# Patient Record
Sex: Female | Born: 2000 | Race: White | Hispanic: No | Marital: Single
Health system: Southern US, Community
[De-identification: ages and names within clinical notes are randomized; demographics above are authoritative.]

## PROBLEM LIST (undated history)

## (undated) DIAGNOSIS — U071 COVID-19: Secondary | ICD-10-CM

## (undated) DIAGNOSIS — J45909 Unspecified asthma, uncomplicated: Secondary | ICD-10-CM

## (undated) DIAGNOSIS — Z6710 Type A blood, Rh positive: Secondary | ICD-10-CM

## (undated) DIAGNOSIS — M542 Cervicalgia: Secondary | ICD-10-CM

## (undated) DIAGNOSIS — M419 Scoliosis, unspecified: Secondary | ICD-10-CM

## (undated) HISTORY — DX: Unspecified asthma, uncomplicated: J45.909

## (undated) HISTORY — DX: COVID-19: U07.1

## (undated) HISTORY — DX: Scoliosis, unspecified: M41.9

## (undated) HISTORY — PX: NO PAST SURGERIES: SHX2092

## (undated) HISTORY — DX: Type A blood, Rh positive: Z67.10

## (undated) HISTORY — DX: Cervicalgia: M54.2

---
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
## 2020-11-05 ENCOUNTER — Telehealth: Payer: Self-pay | Admitting: Internal Medicine

## 2020-11-05 NOTE — Telephone Encounter (Signed)
Agree please sch meredith

## 2020-11-05 NOTE — Telephone Encounter (Signed)
Please advise 

## 2020-11-05 NOTE — Telephone Encounter (Signed)
PT mother Lennart Pall is a patient of Dr.Mclean. Is it okay to schedule daughter as a new a patient.

## 2020-12-08 ENCOUNTER — Other Ambulatory Visit: Payer: Self-pay

## 2020-12-08 ENCOUNTER — Ambulatory Visit: Payer: BC Managed Care – PPO | Admitting: Internal Medicine

## 2020-12-08 ENCOUNTER — Encounter: Payer: Self-pay | Admitting: Internal Medicine

## 2020-12-08 VITALS — BP 110/70 | HR 70 | Temp 98.0°F | Ht 64.96 in | Wt 132.2 lb

## 2020-12-08 DIAGNOSIS — Z0000 Encounter for general adult medical examination without abnormal findings: Secondary | ICD-10-CM

## 2020-12-08 DIAGNOSIS — N6320 Unspecified lump in the left breast, unspecified quadrant: Secondary | ICD-10-CM

## 2020-12-08 DIAGNOSIS — L700 Acne vulgaris: Secondary | ICD-10-CM

## 2020-12-08 DIAGNOSIS — M542 Cervicalgia: Secondary | ICD-10-CM

## 2020-12-08 DIAGNOSIS — M4186 Other forms of scoliosis, lumbar region: Secondary | ICD-10-CM

## 2020-12-08 NOTE — Patient Instructions (Addendum)
Heat salonpas or lidocaine Tylenol as needed  Hot stone massage  volatren gel 4x per day as needed  Ultrasound left breast   Neck Exercises Ask your health care provider which exercises are safe for you. Do exercises exactly as told by your health care provider and adjust them as directed. It is normal to feel mild stretching, pulling, tightness, or discomfort as you do these exercises. Stop right away if you feel sudden pain or your pain gets worse. Do not begin these exercises until told by your health care provider. Neck exercises can be important for many reasons. They can improve strength and maintain flexibility in your neck, which will help your upper back and prevent neck pain. Stretching exercises Rotation neck stretching 1. Sit in a chair or stand up. 2. Place your feet flat on the floor, shoulder width apart. 3. Slowly turn your head (rotate) to the right until a slight stretch is felt. Turn it all the way to the right so you can look over your right shoulder. Do not tilt or tip your head. 4. Hold this position for 10-30 seconds. 5. Slowly turn your head (rotate) to the left until a slight stretch is felt. Turn it all the way to the left so you can look over your left shoulder. Do not tilt or tip your head. 6. Hold this position for 10-30 seconds. Repeat __________ times. Complete this exercise __________ times a day.   Neck retraction 1. Sit in a sturdy chair or stand up. 2. Look straight ahead. Do not bend your neck. 3. Use your fingers to push your chin backward (retraction). Do not bend your neck for this movement. Continue to face straight ahead. If you are doing the exercise properly, you will feel a slight sensation in your throat and a stretch at the back of your neck. 4. Hold the stretch for 1-2 seconds. Repeat __________ times. Complete this exercise __________ times a day. Strengthening exercises Neck press 1. Lie on your back on a firm bed or on the floor with a  pillow under your head. 2. Use your neck muscles to push your head down on the pillow and straighten your spine. 3. Hold the position as well as you can. Keep your head facing up (in a neutral position) and your chin tucked. 4. Slowly count to 5 while holding this position. Repeat __________ times. Complete this exercise __________ times a day. Isometrics These are exercises in which you strengthen the muscles in your neck while keeping your neck still (isometrics). 1. Sit in a supportive chair and place your hand on your forehead. 2. Keep your head and face facing straight ahead. Do not flex or extend your neck while doing isometrics. 3. Push forward with your head and neck while pushing back with your hand. Hold for 10 seconds. 4. Do the sequence again, this time putting your hand against the back of your head. Use your head and neck to push backward against the hand pressure. 5. Finally, do the same exercise on either side of your head, pushing sideways against the pressure of your hand. Repeat __________ times. Complete this exercise __________ times a day. Prone head lifts 1. Lie face-down (prone position), resting on your elbows so that your chest and upper back are raised. 2. Start with your head facing downward, near your chest. Position your chin either on or near your chest. 3. Slowly lift your head upward. Lift until you are looking straight ahead. Then continue lifting your head  as far back as you can comfortably stretch. 4. Hold your head up for 5 seconds. Then slowly lower it to your starting position. Repeat __________ times. Complete this exercise __________ times a day. Supine head lifts 1. Lie on your back (supine position), bending your knees to point to the ceiling and keeping your feet flat on the floor. 2. Lift your head slowly off the floor, raising your chin toward your chest. 3. Hold for 5 seconds. Repeat __________ times. Complete this exercise __________ times a  day. Scapular retraction 1. Stand with your arms at your sides. Look straight ahead. 2. Slowly pull both shoulders (scapulae) backward and downward (retraction) until you feel a stretch between your shoulder blades in your upper back. 3. Hold for 10-30 seconds. 4. Relax and repeat. Repeat __________ times. Complete this exercise __________ times a day. Contact a health care provider if:  Your neck pain or discomfort gets much worse when you do an exercise.  Your neck pain or discomfort does not improve within 2 hours after you exercise. If you have any of these problems, stop exercising right away. Do not do the exercises again unless your health care provider says that you can. Get help right away if:  You develop sudden, severe neck pain. If this happens, stop exercising right away. Do not do the exercises again unless your health care provider says that you can. This information is not intended to replace advice given to you by your health care provider. Make sure you discuss any questions you have with your health care provider. Document Revised: 05/02/2018 Document Reviewed: 05/02/2018 Elsevier Patient Education  2021 [employer]  Low Back Sprain or Strain Rehab Ask your health care provider which exercises are safe for you. Do exercises exactly as told by your health care provider and adjust them as directed. It is normal to feel mild stretching, pulling, tightness, or discomfort as you do these exercises. Stop right away if you feel sudden pain or your pain gets worse. Do not begin these exercises until told by your health care provider. Stretching and range-of-motion exercises These exercises warm up your muscles and joints and improve the movement and flexibility of your back. These exercises also help to relieve pain, numbness, and tingling. Lumbar rotation 1. Lie on your back on a firm surface and bend your knees. 2. Straighten your arms out to your sides so each arm forms a  90-degree angle (right angle) with a side of your body. 3. Slowly move (rotate) both of your knees to one side of your body until you feel a stretch in your lower back (lumbar). Try not to let your shoulders lift off the floor. 4. Hold this position for __________ seconds. 5. Tense your abdominal muscles and slowly move your knees back to the starting position. 6. Repeat this exercise on the other side of your body. Repeat __________ times. Complete this exercise __________ times a day.   Single knee to chest 1. Lie on your back on a firm surface with both legs straight. 2. Bend one of your knees. Use your hands to move your knee up toward your chest until you feel a gentle stretch in your lower back and buttock. ? Hold your leg in this position by holding on to the front of your knee. ? Keep your other leg as straight as possible. 3. Hold this position for __________ seconds. 4. Slowly return to the starting position. 5. Repeat with your other leg. Repeat __________ times. Complete  this exercise __________ times a day.   Prone extension on elbows 1. Lie on your abdomen on a firm surface (prone position). 2. Prop yourself up on your elbows. 3. Use your arms to help lift your chest up until you feel a gentle stretch in your abdomen and your lower back. ? This will place some of your body weight on your elbows. If this is uncomfortable, try stacking pillows under your chest. ? Your hips should stay down, against the surface that you are lying on. Keep your hip and back muscles relaxed. 4. Hold this position for __________ seconds. 5. Slowly relax your upper body and return to the starting position. Repeat __________ times. Complete this exercise __________ times a day.   Strengthening exercises These exercises build strength and endurance in your back. Endurance is the ability to use your muscles for a long time, even after they get tired. Pelvic tilt This exercise strengthens the muscles  that lie deep in the abdomen. 1. Lie on your back on a firm surface. Bend your knees and keep your feet flat on the floor. 2. Tense your abdominal muscles. Tip your pelvis up toward the ceiling and flatten your lower back into the floor. ? To help with this exercise, you may place a small towel under your lower back and try to push your back into the towel. 3. Hold this position for __________ seconds. 4. Let your muscles relax completely before you repeat this exercise. Repeat __________ times. Complete this exercise __________ times a day. Alternating arm and leg raises 1. Get on your hands and knees on a firm surface. If you are on a hard floor, you may want to use padding, such as an exercise mat, to cushion your knees. 2. Line up your arms and legs. Your hands should be directly below your shoulders, and your knees should be directly below your hips. 3. Lift your left leg behind you. At the same time, raise your right arm and straighten it in front of you. ? Do not lift your leg higher than your hip. ? Do not lift your arm higher than your shoulder. ? Keep your abdominal and back muscles tight. ? Keep your hips facing the ground. ? Do not arch your back. ? Keep your balance carefully, and do not hold your breath. 4. Hold this position for __________ seconds. 5. Slowly return to the starting position. 6. Repeat with your right leg and your left arm. Repeat __________ times. Complete this exercise __________ times a day.   Abdominal set with straight leg raise 1. Lie on your back on a firm surface. 2. Bend one of your knees and keep your other leg straight. 3. Tense your abdominal muscles and lift your straight leg up, 4-6 inches (10-15 cm) off the ground. 4. Keep your abdominal muscles tight and hold this position for __________ seconds. ? Do not hold your breath. ? Do not arch your back. Keep it flat against the ground. 5. Keep your abdominal muscles tense as you slowly lower your  leg back to the starting position. 6. Repeat with your other leg. Repeat __________ times. Complete this exercise __________ times a day.   Single leg lower with bent knees 1. Lie on your back on a firm surface. 2. Tense your abdominal muscles and lift your feet off the floor, one foot at a time, so your knees and hips are bent in 90-degree angles (right angles). ? Your knees should be over your hips and your  lower legs should be parallel to the floor. 3. Keeping your abdominal muscles tense and your knee bent, slowly lower one of your legs so your toe touches the ground. 4. Lift your leg back up to return to the starting position. ? Do not hold your breath. ? Do not let your back arch. Keep your back flat against the ground. 5. Repeat with your other leg. Repeat __________ times. Complete this exercise __________ times a day. Posture and body mechanics Good posture and healthy body mechanics can help to relieve stress in your body's tissues and joints. Body mechanics refers to the movements and positions of your body while you do your daily activities. Posture is part of body mechanics. Good posture means:  Your spine is in its natural S-curve position (neutral).  Your shoulders are pulled back slightly.  Your head is not tipped forward. Follow these guidelines to improve your posture and body mechanics in your everyday activities. Standing  When standing, keep your spine neutral and your feet about hip width apart. Keep a slight bend in your knees. Your ears, shoulders, and hips should line up.  When you do a task in which you stand in one place for a long time, place one foot up on a stable object that is 2-4 inches (5-10 cm) high, such as a footstool. This helps keep your spine neutral.   Sitting  When sitting, keep your spine neutral and keep your feet flat on the floor. Use a footrest, if necessary, and keep your thighs parallel to the floor. Avoid rounding your shoulders, and  avoid tilting your head forward.  When working at a desk or a computer, keep your desk at a height where your hands are slightly lower than your elbows. Slide your chair under your desk so you are close enough to maintain good posture.  When working at a computer, place your monitor at a height where you are looking straight ahead and you do not have to tilt your head forward or downward to look at the screen.   Resting  When lying down and resting, avoid positions that are most painful for you.  If you have pain with activities such as sitting, bending, stooping, or squatting, lie in a position in which your body does not bend very much. For example, avoid curling up on your side with your arms and knees near your chest (fetal position).  If you have pain with activities such as standing for a long time or reaching with your arms, lie with your spine in a neutral position and bend your knees slightly. Try the following positions: ? Lying on your side with a pillow between your knees. ? Lying on your back with a pillow under your knees. Lifting  When lifting objects, keep your feet at least shoulder width apart and tighten your abdominal muscles.  Bend your knees and hips and keep your spine neutral. It is important to lift using the strength of your legs, not your back. Do not lock your knees straight out.  Always ask for help to lift heavy or awkward objects.   This information is not intended to replace advice given to you by your health care provider. Make sure you discuss any questions you have with your health care provider. Document Revised: 10/26/2018 Document Reviewed: 07/26/2018 Elsevier Patient Education  2021 [employer]  Back Exercises The following exercises strengthen the muscles that help to support the trunk and back. They also help to keep the lower  back flexible. Doing these exercises can help to prevent back pain or lessen existing pain.  If you have back pain or  discomfort, try doing these exercises 2-3 times each day or as told by your health care provider.  As your pain improves, do them once each day, but increase the number of times that you repeat the steps for each exercise (do more repetitions).  To prevent the recurrence of back pain, continue to do these exercises once each day or as told by your health care provider. Do exercises exactly as told by your health care provider and adjust them as directed. It is normal to feel mild stretching, pulling, tightness, or discomfort as you do these exercises, but you should stop right away if you feel sudden pain or your pain gets worse. Exercises Single knee to chest Repeat these steps 3-5 times for each leg: 5. Lie on your back on a firm bed or the floor with your legs extended. 6. Bring one knee to your chest. Your other leg should stay extended and in contact with the floor. 7. Hold your knee in place by grabbing your knee or thigh with both hands and hold. 8. Pull on your knee until you feel a gentle stretch in your lower back or buttocks. 9. Hold the stretch for 10-30 seconds. 10. Slowly release and straighten your leg. Pelvic tilt Repeat these steps 5-10 times: 5. Lie on your back on a firm bed or the floor with your legs extended. 6. Bend your knees so they are pointing toward the ceiling and your feet are flat on the floor. 7. Tighten your lower abdominal muscles to press your lower back against the floor. This motion will tilt your pelvis so your tailbone points up toward the ceiling instead of pointing to your feet or the floor. 8. With gentle tension and even breathing, hold this position for 5-10 seconds. Cat-cow Repeat these steps until your lower back becomes more flexible: 6. Get into a hands-and-knees position on a firm surface. Keep your hands under your shoulders, and keep your knees under your hips. You may place padding under your knees for comfort. 7. Let your head hang down  toward your chest. Contract your abdominal muscles and point your tailbone toward the floor so your lower back becomes rounded like the back of a cat. 8. Hold this position for 5 seconds. 9. Slowly lift your head, let your abdominal muscles relax and point your tailbone up toward the ceiling so your back forms a sagging arch like the back of a cow. 10. Hold this position for 5 seconds.   Press-ups Repeat these steps 5-10 times: 5. Lie on your abdomen (face-down) on the floor. 6. Place your palms near your head, about shoulder-width apart. 7. Keeping your back as relaxed as possible and keeping your hips on the floor, slowly straighten your arms to raise the top half of your body and lift your shoulders. Do not use your back muscles to raise your upper torso. You may adjust the placement of your hands to make yourself more comfortable. 8. Hold this position for 5 seconds while you keep your back relaxed. 9. Slowly return to lying flat on the floor.   Bridges Repeat these steps 10 times: 4. Lie on your back on a firm surface. 5. Bend your knees so they are pointing toward the ceiling and your feet are flat on the floor. Your arms should be flat at your sides, next to your body. 6.  Tighten your buttocks muscles and lift your buttocks off the floor until your waist is at almost the same height as your knees. You should feel the muscles working in your buttocks and the back of your thighs. If you do not feel these muscles, slide your feet 1-2 inches farther away from your buttocks. 7. Hold this position for 3-5 seconds. 8. Slowly lower your hips to the starting position, and allow your buttocks muscles to relax completely. If this exercise is too easy, try doing it with your arms crossed over your chest.   Abdominal crunches Repeat these steps 5-10 times: 5. Lie on your back on a firm bed or the floor with your legs extended. 6. Bend your knees so they are pointing toward the ceiling and your feet  are flat on the floor. 7. Cross your arms over your chest. 8. Tip your chin slightly toward your chest without bending your neck. 9. Tighten your abdominal muscles and slowly raise your trunk (torso) high enough to lift your shoulder blades a tiny bit off the floor. Avoid raising your torso higher than that because it can put too much stress on your low back and does not help to strengthen your abdominal muscles. 10. Slowly return to your starting position. Back lifts Repeat these steps 5-10 times: 1. Lie on your abdomen (face-down) with your arms at your sides, and rest your forehead on the floor. 2. Tighten the muscles in your legs and your buttocks. 3. Slowly lift your chest off the floor while you keep your hips pressed to the floor. Keep the back of your head in line with the curve in your back. Your eyes should be looking at the floor. 4. Hold this position for 3-5 seconds. 5. Slowly return to your starting position. Contact a health care provider if:  Your back pain or discomfort gets much worse when you do an exercise.  Your worsening back pain or discomfort does not lessen within 2 hours after you exercise. If you have any of these problems, stop doing these exercises right away. Do not do them again unless your health care provider says that you can. Get help right away if:  You develop sudden, severe back pain. If this happens, stop doing the exercises right away. Do not do them again unless your health care provider says that you can. This information is not intended to replace advice given to you by your health care provider. Make sure you discuss any questions you have with your health care provider. Document Revised: 11/08/2018 Document Reviewed: 04/05/2018 Elsevier Patient Education  2021 [employer].
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
## 2020-12-09 ENCOUNTER — Encounter: Payer: Self-pay | Admitting: Internal Medicine

## 2020-12-09 DIAGNOSIS — L700 Acne vulgaris: Secondary | ICD-10-CM | POA: Insufficient documentation

## 2020-12-09 DIAGNOSIS — Z0000 Encounter for general adult medical examination without abnormal findings: Secondary | ICD-10-CM | POA: Insufficient documentation

## 2020-12-09 HISTORY — DX: Encounter for general adult medical examination without abnormal findings: Z00.00

## 2020-12-11 ENCOUNTER — Other Ambulatory Visit: Payer: Self-pay

## 2020-12-11 ENCOUNTER — Ambulatory Visit
Admission: RE | Admit: 2020-12-11 | Discharge: 2020-12-11 | Disposition: A | Discharge status: Home / Self Care | Payer: BC Managed Care – PPO | Source: Ambulatory Visit | Attending: Internal Medicine | Admitting: Internal Medicine

## 2020-12-11 DIAGNOSIS — N6320 Unspecified lump in the left breast, unspecified quadrant: Secondary | ICD-10-CM | POA: Insufficient documentation

## 2020-12-24 ENCOUNTER — Other Ambulatory Visit (INDEPENDENT_AMBULATORY_CARE_PROVIDER_SITE_OTHER): Payer: BC Managed Care – PPO

## 2020-12-24 ENCOUNTER — Other Ambulatory Visit: Payer: Self-pay

## 2020-12-24 DIAGNOSIS — D729 Disorder of white blood cells, unspecified: Secondary | ICD-10-CM

## 2020-12-24 DIAGNOSIS — Z0000 Encounter for general adult medical examination without abnormal findings: Secondary | ICD-10-CM

## 2020-12-24 DIAGNOSIS — M4186 Other forms of scoliosis, lumbar region: Secondary | ICD-10-CM | POA: Diagnosis not present

## 2020-12-24 LAB — CBC WITH DIFFERENTIAL/PLATELET
Basophils Absolute: 0 10*3/uL (ref 0.0–0.1)
Basophils Relative: 0.5 % (ref 0.0–3.0)
Eosinophils Absolute: 0.1 10*3/uL (ref 0.0–0.7)
Eosinophils Relative: 1.8 % (ref 0.0–5.0)
HCT: 39.8 % (ref 36.0–46.0)
Hemoglobin: 13.5 g/dL (ref 12.0–15.0)
Lymphocytes Relative: 41.5 % (ref 12.0–46.0)
Lymphs Abs: 1.6 10*3/uL (ref 0.7–4.0)
MCHC: 33.9 g/dL (ref 30.0–36.0)
MCV: 95.1 fl (ref 78.0–100.0)
Monocytes Absolute: 0.4 10*3/uL (ref 0.1–1.0)
Monocytes Relative: 9.6 % (ref 3.0–12.0)
Neutro Abs: 1.8 10*3/uL (ref 1.4–7.7)
Neutrophils Relative %: 46.6 % (ref 43.0–77.0)
Platelets: 235 10*3/uL (ref 150.0–400.0)
RBC: 4.18 Mil/uL (ref 3.87–5.11)
RDW: 13.8 % (ref 11.5–14.6)
WBC: 3.9 10*3/uL — ABNORMAL LOW (ref 4.5–10.5)

## 2020-12-24 LAB — LIPID PANEL
Cholesterol: 160 mg/dL (ref 0–200)
HDL: 73.8 mg/dL (ref >39.00)
LDL Cholesterol: 69 mg/dL (ref 0–99)
NonHDL: 86.12
Total CHOL/HDL Ratio: 2
Triglycerides: 84 mg/dL (ref 0.0–149.0)
VLDL: 16.8 mg/dL (ref 0.0–40.0)

## 2020-12-24 LAB — COMPREHENSIVE METABOLIC PANEL
ALT: 22 U/L (ref 0–35)
AST: 26 U/L (ref 0–37)
Albumin: 4.2 g/dL (ref 3.5–5.2)
Alkaline Phosphatase: 30 U/L — ABNORMAL LOW (ref 39–117)
BUN: 21 mg/dL (ref 6–23)
CO2: 28 mEq/L (ref 19–32)
Calcium: 9 mg/dL (ref 8.4–10.5)
Chloride: 103 mEq/L (ref 96–112)
Creatinine, Ser: 0.8 mg/dL (ref 0.40–1.20)
GFR: 106.19 mL/min (ref >60.00)
Glucose, Bld: 83 mg/dL (ref 70–99)
Potassium: 4.1 mEq/L (ref 3.5–5.1)
Sodium: 137 mEq/L (ref 135–145)
Total Bilirubin: 0.4 mg/dL (ref 0.2–1.2)
Total Protein: 7.1 g/dL (ref 6.0–8.3)

## 2020-12-24 LAB — TSH: TSH: 1.16 u[IU]/mL (ref 0.35–5.50)

## 2020-12-24 LAB — VITAMIN D 25 HYDROXY (VIT D DEFICIENCY, FRACTURES): VITD: 47.15 ng/mL (ref 30.00–100.00)

## 2020-12-25 LAB — URINALYSIS, ROUTINE W REFLEX MICROSCOPIC
Bilirubin Urine: NEGATIVE
Glucose, UA: NEGATIVE
Hgb urine dipstick: NEGATIVE
Ketones, ur: NEGATIVE
Leukocytes,Ua: NEGATIVE
Nitrite: NEGATIVE
Protein, ur: NEGATIVE
Specific Gravity, Urine: 1.018 (ref 1.001–1.035)
pH: 7 (ref 5.0–8.0)

## 2020-12-25 LAB — HEPATITIS B SURFACE ANTIBODY, QUANTITATIVE: Hep B S AB Quant (Post): <5 m[IU]/mL — ABNORMAL LOW (ref >=10)

## 2020-12-29 NOTE — Addendum Note (Signed)
Addended byElise Benne T on: 12/29/2020 03:10 PM   Modules accepted: Orders

## 2021-01-21 ENCOUNTER — Encounter: Payer: Self-pay | Admitting: Internal Medicine

## 2021-01-21 ENCOUNTER — Other Ambulatory Visit: Payer: Self-pay

## 2021-01-21 ENCOUNTER — Ambulatory Visit: Payer: BC Managed Care – PPO | Admitting: Internal Medicine

## 2021-01-21 VITALS — BP 98/68 | HR 52 | Temp 97.6°F | Ht 64.96 in | Wt 129.4 lb

## 2021-01-21 DIAGNOSIS — R5383 Other fatigue: Secondary | ICD-10-CM

## 2021-01-21 DIAGNOSIS — Z0000 Encounter for general adult medical examination without abnormal findings: Secondary | ICD-10-CM

## 2021-01-21 DIAGNOSIS — Z23 Encounter for immunization: Secondary | ICD-10-CM

## 2021-01-21 DIAGNOSIS — D72819 Decreased white blood cell count, unspecified: Secondary | ICD-10-CM

## 2021-01-21 DIAGNOSIS — E538 Deficiency of other specified B group vitamins: Secondary | ICD-10-CM | POA: Diagnosis not present

## 2021-01-21 LAB — CBC WITH DIFFERENTIAL/PLATELET
Basophils Absolute: 0 10*3/uL (ref 0.0–0.1)
Basophils Relative: 0.6 % (ref 0.0–3.0)
Eosinophils Absolute: 0 10*3/uL (ref 0.0–0.7)
Eosinophils Relative: 1.1 % (ref 0.0–5.0)
HCT: 41 % (ref 36.0–46.0)
Hemoglobin: 13.9 g/dL (ref 12.0–15.0)
Lymphocytes Relative: 41.3 % (ref 12.0–46.0)
Lymphs Abs: 1.8 10*3/uL (ref 0.7–4.0)
MCHC: 34 g/dL (ref 30.0–36.0)
MCV: 95.7 fl (ref 78.0–100.0)
Monocytes Absolute: 0.4 10*3/uL (ref 0.1–1.0)
Monocytes Relative: 8.4 % (ref 3.0–12.0)
Neutro Abs: 2.1 10*3/uL (ref 1.4–7.7)
Neutrophils Relative %: 48.6 % (ref 43.0–77.0)
Platelets: 281 10*3/uL (ref 150.0–400.0)
RBC: 4.28 Mil/uL (ref 3.87–5.11)
RDW: 13.7 % (ref 11.5–14.6)
WBC: 4.3 10*3/uL — ABNORMAL LOW (ref 4.5–10.5)

## 2021-01-21 LAB — VITAMIN B12: Vitamin B-12: 683 pg/mL (ref 211–911)
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
## 2021-01-21 NOTE — Patient Instructions (Signed)
Hiatal Hernia A hiatal hernia occurs when part of the stomach slides above the muscle that separates the abdomen from the chest (diaphragm). A person can be born with a hiatal hernia (congenital), or it may develop over time. In almost all cases of hiatal hernia, only the top part of the stomach pushes through the diaphragm. Many people have a hiatal hernia with no symptoms. The larger the hernia, the more likely it is that you will have symptoms. In some cases, a hiatal hernia allows stomach acid to flow back into the tube that carries food from your mouth to your stomach (esophagus). This may cause heartburn symptoms. Severe heartburn symptoms may mean that you have developed a condition called gastroesophageal reflux disease (GERD). What are the causes? This condition is caused by a weakness in the opening (hiatus) where the esophagus passes through the diaphragm to attach to the upper part of the stomach. A person may be born with a weakness in the hiatus, or a weakness can develop over time. What increases the risk? This condition is more likely to develop in: Older people. Age is a major risk factor for a hiatal hernia, especially if you are over the age of 50. Pregnant women. People who are overweight. People who have frequent constipation. What are the signs or symptoms? Symptoms of this condition usually develop in the form of GERD symptoms. Symptoms include: Heartburn. Belching. Indigestion. Trouble swallowing. Coughing or wheezing. Sore throat. Hoarseness. Chest pain. Nausea and vomiting. How is this diagnosed? This condition may be diagnosed during testing for GERD. Tests that may be done include: X-rays of your stomach or chest. An upper gastrointestinal (GI) series. This is an X-ray exam of your GI tract that is taken after you swallow a chalky liquid that shows up clearly on the X-ray. Endoscopy. This is a procedure to look into your stomach using a thin, flexible tube that  has a tiny camera and light on the end of it. How is this treated? This condition may be treated by: Dietary and lifestyle changes to help reduce GERD symptoms. Medicines. These may include: Over-the-counter antacids. Medicines that make your stomach empty more quickly. Medicines that block the production of stomach acid (H2 blockers). Stronger medicines to reduce stomach acid (proton pump inhibitors). Surgery to repair the hernia, if other treatments are not helping. If you have no symptoms, you may not need treatment. Follow these instructions at home: Lifestyle and activity Do not use any products that contain nicotine or tobacco, such as cigarettes and e-cigarettes. If you need help quitting, ask your health care provider. Try to achieve and maintain a healthy body weight. Avoid putting pressure on your abdomen. Anything that puts pressure on your abdomen increases the amount of acid that may be pushed up into your esophagus. Avoid bending over, especially after eating. Raise the head of your bed by putting blocks under the legs. This keeps your head and esophagus higher than your stomach. Do not wear tight clothing around your chest or stomach. Try not to strain when having a bowel movement, when urinating, or when lifting heavy objects. Eating and drinking Avoid foods that can worsen GERD symptoms. These may include: Fatty foods, like fried foods. Citrus fruits, like oranges or lemon. Other foods and drinks that contain acid, like orange juice or tomatoes. Spicy food. Chocolate. Eat frequent small meals instead of three large meals a day. This helps prevent your stomach from getting too full. Eat slowly. Do not lie down right after   eating. Do not eat 1-2 hours before bed. Do not drink beverages with caffeine. These include cola, coffee, cocoa, and tea. Do not drink alcohol. General instructions Take over-the-counter and prescription medicines only as told by your health care  provider. Keep all follow-up visits as told by your health care provider. This is important. Contact a health care provider if: Your symptoms are not controlled with medicines or lifestyle changes. You are having trouble swallowing. You have coughing or wheezing that will not go away. Get help right away if: Your pain is getting worse. Your pain spreads to your arms, neck, jaw, teeth, or back. You have shortness of breath. You sweat for no reason. You feel sick to your stomach (nauseous) or you vomit. You vomit blood. You have bright red blood in your stools. You have black, tarry stools. Summary A hiatal hernia occurs when part of the stomach slides above the muscle that separates the abdomen from the chest (diaphragm). A person may be born with a weakness in the hiatus, or a weakness can develop over time. Symptoms of hiatal hernia may include heartburn, trouble swallowing, or sore throat. Management of hiatal hernia includes eating frequent small meals instead of three large meals a day. Get help right away if you vomit blood, have bright red blood in your stools, or have black, tarry stools. This information is not intended to replace advice given to you by your health care provider. Make sure you discuss any questions you have with your health care provider. Document Revised: 06/04/2020 Document Reviewed: 06/04/2020 Elsevier Patient Education  2022 [employer]  

## 2021-02-16 ENCOUNTER — Encounter: Payer: Self-pay | Admitting: Physician Assistant

## 2021-02-16 ENCOUNTER — Telehealth: Payer: Self-pay | Admitting: Internal Medicine

## 2021-02-16 ENCOUNTER — Telehealth: Payer: BC Managed Care – PPO | Admitting: Physician Assistant

## 2021-02-16 DIAGNOSIS — U071 COVID-19: Secondary | ICD-10-CM

## 2021-02-16 MED ORDER — ONDANSETRON HCL 4 MG PO TABS: 4.0000 mg | ORAL_TABLET | Freq: Three times a day (TID) | ORAL | 0 refills | Status: DC | PRN

## 2021-02-16 MED ORDER — BENZONATATE 100 MG PO CAPS
100.0000 mg | ORAL_CAPSULE | Freq: Three times a day (TID) | ORAL | 0 refills | Status: DC | PRN
Start: 2021-02-16 — End: 2021-08-25

## 2021-02-16 MED ORDER — IPRATROPIUM BROMIDE 0.03 % NA SOLN: 2.0000 | Freq: Two times a day (BID) | NASAL | 12 refills | Status: DC

## 2021-02-16 NOTE — Telephone Encounter (Signed)
Pt c/o sore throat, fatigue, cough, increased bowel movements and abdominal pain. Pt states that the symptoms started on Sunday 02/14/21. Pt reports no SOB or dizziness. Pt took a home test on 02/16/21 and tested positive. Consulted with PCP and recommended patient to have a mychart urgent care visit to see a [place] doctor today. Patient was concerned about the Hep B vaccine and gave PCP recommendation of 14 day quarantine. Pt was concerned about receiving the Hep B vaccine as she will be in school in a few weeks. Pt states that she will try to call in and schedule for the Hep B vaccine and may get her vaccine from the pharmacy.

## 2021-02-16 NOTE — Patient Instructions (Signed)
Hello Ted,  You are being placed in the home monitoring program for COVID-19 (commonly known as Coronavirus).  This is because you are suspected to have the virus or are known to have the virus.  If you are unsure which group you fall into call your clinic.    As part of this program, you'll answer a daily questionnaire in the MyChart mobile app. You'll receive a notification through the MyChart app when the questionnaire is available. When you log in to MyChart, you'll see the tasks in your To Do activity.       Clinicians will see any answers that are concerning and take appropriate steps.  If at any point you are having a medical emergency, call 911.  If otherwise concerned call your clinic instead of coming into the clinic or hospital.  To keep from spreading the disease you should: Stay home and limit contact with other people as much as possible.  Wash your hands frequently. Cover your coughs and sneezes with a tissue, and throw used tissues in the trash.   Clean and disinfect frequently touched surfaces and objects.    Take care of yourself by: Staying home Resting Drinking fluids Take fever-reducing medications (Tylenol/Acetaminophen and Ibuprofen)  For more information on the disease go to the Centers for Disease Control and Prevention website     COVID-19: What to Do if You Are Sick CDC has updated isolation and quarantine recommendations for the public, and is revising the CDC website to reflect these changes. These recommendations do not apply to healthcare personnel and do not supersede state, local, tribal, or territorial laws, rules, andregulations. If you have a fever, cough or other symptoms, you might have COVID-19. Most people have mild illness and are able to recover at home. If you are sick: Keep track of your symptoms. If you have an emergency warning sign (including trouble breathing), call 911. Steps to help prevent the spread of COVID-19 if you are  sick If you are sick with COVID-19 or think you might have COVID-19, follow the steps below to care for yourself and to help protect other peoplein your home and community. Stay home except to get medical care Stay home. Most people with COVID-19 have mild illness and can recover at home without medical care. Do not leave your home, except to get medical care. Do not visit public areas. Take care of yourself. Get rest and stay hydrated. Take over-the-counter medicines, such as acetaminophen, to help you feel better. Stay in touch with your doctor. Call before you get medical care. Be sure to get care if you have trouble breathing, or have any other emergency warning signs, or if you think it is an emergency. Avoid public transportation, ride-sharing, or taxis. Separate yourself from other people As much as possible, stay in a specific room and away from other people and pets in your home. If possible, you should use a separate bathroom. If you need to be around other people or animals in oroutside of the home, wear a mask. Tell your close contactsthat they may have been exposed to COVID-19. An infected person can spread COVID-19 starting 48 hours (or 2 days) before the person has any symptoms or tests positive. By letting your close contacts know they may have been exposed to COVID-19, you are helping to protect everyone. Additional guidance is available for those living in close quarters and shared housing. See COVID-19 and Animals if you have questions about pets. If you are diagnosed with  COVID-19, someone from the health department may call you. Answer the call to slow the spread. Monitor your symptoms Symptoms of COVID-19 include fever, cough, or other symptoms. Follow care instructions from your healthcare provider and local health department. Your local health authorities may give instructions on checking your symptoms and reporting information. When to seek emergency medical attention Look  for emergency warning signs* for COVID-19. If someone is showing any of these signs, seek emergency medical care immediately: Trouble breathing Persistent pain or pressure in the chest New confusion Inability to wake or stay awake Pale, gray, or blue-colored skin, lips, or nail beds, depending on skin tone *This list is not all possible symptoms. Please call your medical provider forany other symptoms that are severe or concerning to you. Call 911 or call ahead to your local emergency facility: Notify the operator that you are seeking care for someone who has or may haveCOVID-19. Call ahead before visiting your doctor Call ahead. Many medical visits for routine care are being postponed or done by phone or telemedicine. If you have a medical appointment that cannot be postponed, call your doctor's office, and tell them you have or may have COVID-19. This will help the office protect themselves and other patients. Get tested If you have symptoms of COVID-19, get tested. While waiting for test results, you stay away from others, including staying apart from those living in your household. Self-tests are one of several options for testing for the virus that causes COVID-19 and may be more convenient than laboratory-based tests and point-of-care tests. Ask your healthcare provider or your local health department if you need help interpreting your test results. You can visit your state, tribal, local, and territorial health department's website to look for the latest local information on testing sites. If you are sick, wear a mask over your nose and mouth You should wear a mask over your nose and mouth if you must be around other people or animals, including pets (even at home). You don't need to wear the mask if you are alone. If you can't put on a mask (because of trouble breathing, for example), cover your coughs and sneezes in some other way. Try to stay at least 6 feet away from other people. This  will help protect the people around you. Masks should not be placed on young children under age 47 years, anyone who has trouble breathing, or anyone who is not able to remove the mask without help. Note: During the COVID-19 pandemic, medical grade facemasks are reserved forhealthcare workers and some first responders. Cover your coughs and sneezes Cover your mouth and nose with a tissue when you cough or sneeze. Throw away used tissues in a lined trash can. Immediately wash your hands with soap and water for at least 20 seconds. If soap and water are not available, clean your hands with an alcohol-based hand sanitizer that contains at least 60% alcohol. Clean your hands often Wash your hands often with soap and water for at least 20 seconds. This is especially important after blowing your nose, coughing, or sneezing; going to the bathroom; and before eating or preparing food. Use hand sanitizer if soap and water are not available. Use an alcohol-based hand sanitizer with at least 60% alcohol, covering all surfaces of your hands and rubbing them together until they feel dry. Soap and water are the best option, especially if hands are visibly dirty. Avoid touching your eyes, nose, and mouth with unwashed hands. Handwashing Tips Avoid sharing  personal household items Do not share dishes, drinking glasses, cups, eating utensils, towels, or bedding with other people in your home. Wash these items thoroughly after using them with soap and water or put in the dishwasher. Clean all "high-touch" surfaces every day Clean and disinfect high-touch surfaces in your "sick room" and bathroom; wear disposable gloves. Let someone else clean and disinfect surfaces in common areas, but you should clean your bedroom and bathroom, if possible. If a caregiver or other person needs to clean and disinfect a sick person's bedroom or bathroom, they should do so on an as-needed basis. The caregiver/other person should wear a  mask and disposable gloves prior to cleaning. They should wait as long as possible after the person who is sick has used the bathroom before coming in to clean and use the bathroom. High-touch surfaces include phones, remote controls, counters, tabletops, doorknobs, bathroom fixtures, toilets, keyboards, tablets, and bedside tables. Clean and disinfect areas that may have blood, stool, or body fluids on them. Use household cleaners and disinfectants. Clean the area or item with soap and water or another detergent if it is dirty. Then, use a household disinfectant. Be sure to follow the instructions on the label to ensure safe and effective use of the product. Many products recommend keeping the surface wet for several minutes to ensure germs are killed. Many also recommend precautions such as wearing gloves and making sure you have good ventilation during use of the product. Use a product from [employer] List N: Disinfectants for Coronavirus (COVID-19). Complete Disinfection Guidance When you can be around others after being sick with COVID-19 Deciding when you can be around others is different for different situations. Find out when you can safely end home isolation. For any additional questions about your care,contact your healthcare provider or state or local health department. 06/24/2020 Content source: [facility] for Immunization and Respiratory Diseases ([facility]), Division of Viral Diseases This information is not intended to replace advice given to you by your health care provider. Make sure you discuss any questions you have with your healthcare provider. Document Revised: 08/21/2020 Document Reviewed: 08/21/2020 Elsevier Patient Education  2022 Elsevier Inc.   Can take to lessen severity: Vit C 500mg  twice daily Quercertin 250-500mg  twice daily Zinc 75-100mg  daily Melatonin 3-6 mg at bedtime Vit D3 1000-2000 IU daily Aspirin 81 mg daily with food Optional: Famotidine 20mg  daily Also  can add tylenol/ibuprofen as needed for fevers and body aches May add Mucinex or Mucinex DM as needed for cough/congestion  10 Things You Can Do to Manage Your COVID-19 Symptoms at Home If you have possible or confirmed COVID-19 Stay home except to get medical care. Monitor your symptoms carefully. If your symptoms get worse, call your healthcare provider immediately. Get rest and stay hydrated. If you have a medical appointment, call the healthcare provider ahead of time and tell them that you have or may have COVID-19. For medical emergencies, call 911 and notify the dispatch personnel that you have or may have COVID-19. Cover your cough and sneezes with a tissue or use the inside of your elbow. Wash your hands often with soap and water for at least 20 seconds or clean your hands with an alcohol-based hand sanitizer that contains at least 60% alcohol. As much as possible, stay in a specific room and away from other people in your home. Also, you should use a separate bathroom, if available. If you need to be around other people in or outside of the home,  wear a mask. Avoid sharing personal items with other people in your household, like dishes, towels, and bedding. Clean all surfaces that are touched often, like counters, tabletops, and doorknobs. Use household cleaning sprays or wipes according to the label instructions. [email or web address] 01/31/2020 This information is not intended to replace advice given to you by your health care provider. Make sure you discuss any questions you have with your healthcare provider. Document Revised: 08/21/2020 Document Reviewed: 08/21/2020 Elsevier Patient Education  [place].

## 2021-02-16 NOTE — Progress Notes (Signed)
Virtual Visit Consent   Amanda Finley, you are scheduled for a virtual visit with a [facility] provider today.     Just as with appointments in the office, your consent must be obtained to participate.  Your consent will be active for this visit and any virtual visit you may have with one of our providers in the next 365 days.     If you have a MyChart account, a copy of this consent can be sent to you electronically.  All virtual visits are billed to your insurance company just like a traditional visit in the office.    As this is a virtual visit, video technology does not allow for your provider to perform a traditional examination.  This may limit your provider's ability to fully assess your condition.  If your provider identifies any concerns that need to be evaluated in person or the need to arrange testing (such as labs, EKG, etc.), we will make arrangements to do so.     Although advances in technology are sophisticated, we cannot ensure that it will always work on either your end or our end.  If the connection with a video visit is poor, the visit may have to be switched to a telephone visit.  With either a video or telephone visit, we are not always able to ensure that we have a secure connection.     I need to obtain your verbal consent now.   Are you willing to proceed with your visit today?    Amanda Finley has provided verbal consent on 02/16/2021 for a virtual visit (video or telephone).   Amanda Loveless, PA-C   Date: 02/16/2021 4:18 PM   Virtual Visit via Video Note   IMargaretann Finley, connected with  Amanda Finley  ([id number], 09-04-2000) on 02/16/21 at  4:00 PM EDT by a video-enabled telemedicine application and verified that I am speaking with the correct person using two identifiers.  Location: Patient: Virtual Visit Location Patient: Home Provider: Virtual Visit Location Provider: Home Office   I discussed the limitations of  evaluation and management by telemedicine and the availability of in person appointments. The patient expressed understanding and agreed to proceed.    History of Present Illness: Amanda Finley is a 20 y.o. who identifies as a female who was assigned female at birth, and is being seen today for Covid 52.  HPI: URI  This is a new problem. The current episode started today (symptoms started on Sunday, tested positive today for Covid 19). The problem has been gradually worsening. Maximum temperature: 99.4. Associated symptoms include congestion, headaches, nausea, rhinorrhea, sinus pain and a sore throat (scratchy). Pertinent negatives include no diarrhea, ear pain, plugged ear sensation or vomiting. Associated symptoms comments: Hot flashes, myalgias. Treatments tried: sudafed, tylenol. The treatment provided mild relief.    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 (Patient not taking: Reported on 01/21/2021), Disp: , Rfl:    Adapalene-Benzoyl Peroxide 0.3-2.5 % GEL, , Disp: , Rfl:    fexofenadine (ALLEGRA) 180 MG tablet, Take 180 mg by mouth daily., Disp: , Rfl:    Probiotic Product (PROBIOTIC DAILY PO), Take by mouth., Disp: , Rfl:    TRI-ESTARYLLA 0.18/0.215/0.25 MG-35 MCG tablet, Take 1 tablet by  mouth daily., Disp: , Rfl:   Observations/Objective: Patient is well-developed, well-nourished in no acute distress.  Resting comfortably at home.  Head is normocephalic, atraumatic.  No labored breathing.  Speech is clear and coherent with logical content.  Patient is alert and oriented at baseline.    Assessment and Plan: 1. COVID-19 - ipratropium (ATROVENT) 0.03 % nasal spray; Place 2 sprays into both nostrils every 12 (twelve) hours.  Dispense: 30 mL; Refill: 12 - ondansetron (ZOFRAN) 4 MG tablet; Take 1 tablet (4 mg total) by mouth every 8 (eight) hours as needed for nausea or vomiting.  Dispense: 20 tablet; Refill: 0 - benzonatate (TESSALON) 100 MG capsule; Take 1 capsule (100 mg total) by mouth 3 (three) times daily as needed.  Dispense: 30 capsule; Refill: 0 - Continue OTC symptomatic management of choice - Will send OTC vitamins and supplement information through AVS - Zofran, tessalon, and ipratropium nasal spray provided for patient for symptomatic management - Patient enrolled in MyChart symptom monitoring - Push fluids - Rest as needed - Discussed return precautions and when to seek in-person evaluation, sent via AVS as well  Follow Up Instructions: I discussed the assessment and treatment plan with the patient. The patient was provided an opportunity to ask questions and all were answered. The patient agreed with the plan and demonstrated an understanding of the instructions.  A copy of instructions were sent to the patient via MyChart.  The patient was advised to call back or seek an in-person evaluation if the symptoms worsen or if the condition fails to improve as anticipated.  Time:  I spent 20 minutes with the patient via telehealth technology discussing the above problems/concerns.    Amanda Loveless, PA-C

## 2021-02-16 NOTE — Telephone Encounter (Signed)
Sorry for covid + 02/16/21. She can reschedule 2nd hep B 4th week in 02/2021 new vaccine 2nd shot  If needing prescription strength medication we will need to make an appointment with a provider.  These are over the counter medication options:  Mucinex dm green label for cough.  Vitamin C 1000 mg daily.  Vitamin D3 4000 Iu (units) daily.  Zinc 100 mg daily.  Quercetin 250-500 mg 2 times per day   Elderberry  Oil of oregano  cepacol or chloroseptic spray  Warm tea with honey and lemon  Hydration  Try to eat though you dont feel like it   Tylenol or Advil  Nasal saline, Flonase  Over the counter allergy medication allegra, claritin, xyzal or zyrtec     Monitor pulse oximeter, buy from [facility] if oxygen is less than 90 please go to the hospital.        Are you feeling really sick? Shortness of breath, cough, chest pain?, dizziness? Confusion   If so let me know  If worsening, go to hospital or [facility] clinic Urgent care for further treatment.

## 2021-02-16 NOTE — Telephone Encounter (Signed)
Patient tested positive for covid this morning and is requesting antiviral medication. Also patient had to cancel nurse visit for Hep B and would like to know when to come for that injection.

## 2021-02-22 ENCOUNTER — Ambulatory Visit: Payer: BC Managed Care – PPO

## 2021-03-02 ENCOUNTER — Ambulatory Visit (INDEPENDENT_AMBULATORY_CARE_PROVIDER_SITE_OTHER): Payer: BC Managed Care – PPO

## 2021-03-02 ENCOUNTER — Other Ambulatory Visit: Payer: Self-pay

## 2021-03-02 DIAGNOSIS — Z23 Encounter for immunization: Secondary | ICD-10-CM

## 2021-03-02 NOTE — Progress Notes (Signed)
Patient presenting for second Hepatitis B vaccine in 2 shot series. Given tin the left deltoid, Patient tolerated well.

## 2021-04-28 ENCOUNTER — Ambulatory Visit: Payer: BC Managed Care – PPO

## 2021-04-28 ENCOUNTER — Other Ambulatory Visit: Payer: Self-pay

## 2021-04-28 DIAGNOSIS — Z23 Encounter for immunization: Secondary | ICD-10-CM

## 2021-08-25 ENCOUNTER — Ambulatory Visit: Payer: BC Managed Care – PPO | Admitting: Internal Medicine

## 2021-08-25 ENCOUNTER — Telehealth: Payer: Self-pay | Admitting: Internal Medicine

## 2021-08-25 ENCOUNTER — Other Ambulatory Visit: Payer: Self-pay

## 2021-08-25 ENCOUNTER — Encounter: Payer: Self-pay | Admitting: Internal Medicine

## 2021-08-25 VITALS — BP 112/68 | HR 71 | Temp 98.7°F | Ht 64.0 in | Wt 138.4 lb

## 2021-08-25 DIAGNOSIS — E611 Iron deficiency: Secondary | ICD-10-CM | POA: Diagnosis not present

## 2021-08-25 DIAGNOSIS — R221 Localized swelling, mass and lump, neck: Secondary | ICD-10-CM

## 2021-08-25 DIAGNOSIS — R0902 Hypoxemia: Secondary | ICD-10-CM

## 2021-08-25 DIAGNOSIS — R220 Localized swelling, mass and lump, head: Secondary | ICD-10-CM

## 2021-08-25 DIAGNOSIS — D72819 Decreased white blood cell count, unspecified: Secondary | ICD-10-CM

## 2021-08-25 DIAGNOSIS — G4700 Insomnia, unspecified: Secondary | ICD-10-CM

## 2021-08-25 DIAGNOSIS — G479 Sleep disorder, unspecified: Secondary | ICD-10-CM | POA: Insufficient documentation

## 2021-08-25 DIAGNOSIS — R5383 Other fatigue: Secondary | ICD-10-CM | POA: Diagnosis not present

## 2021-08-25 DIAGNOSIS — R590 Localized enlarged lymph nodes: Secondary | ICD-10-CM

## 2021-08-25 HISTORY — DX: Decreased white blood cell count, unspecified: D72.819

## 2021-08-25 HISTORY — DX: Sleep disorder, unspecified: G47.9

## 2021-08-25 LAB — CBC WITH DIFFERENTIAL/PLATELET
Basophils Absolute: 0 10*3/uL (ref 0.0–0.1)
Basophils Relative: 0.5 % (ref 0.0–3.0)
Eosinophils Absolute: 0 10*3/uL (ref 0.0–0.7)
Eosinophils Relative: 0.6 % (ref 0.0–5.0)
HCT: 42.4 % (ref 36.0–46.0)
Hemoglobin: 14 g/dL (ref 12.0–15.0)
Lymphocytes Relative: 29.7 % (ref 12.0–46.0)
Lymphs Abs: 1.5 10*3/uL (ref 0.7–4.0)
MCHC: 32.9 g/dL (ref 30.0–36.0)
MCV: 96.8 fl (ref 78.0–100.0)
Monocytes Absolute: 0.4 10*3/uL (ref 0.1–1.0)
Monocytes Relative: 8.9 % (ref 3.0–12.0)
Neutro Abs: 3 10*3/uL (ref 1.4–7.7)
Neutrophils Relative %: 60.3 % (ref 43.0–77.0)
Platelets: 251 10*3/uL (ref 150.0–400.0)
RBC: 4.38 Mil/uL (ref 3.87–5.11)
RDW: 13.6 % (ref 11.5–14.6)
WBC: 4.9 10*3/uL (ref 4.5–10.5)

## 2021-08-25 LAB — COMPREHENSIVE METABOLIC PANEL
ALT: 17 U/L (ref 0–35)
AST: 21 U/L (ref 0–37)
Albumin: 4.3 g/dL (ref 3.5–5.2)
Alkaline Phosphatase: 39 U/L (ref 39–117)
BUN: 15 mg/dL (ref 6–23)
CO2: 30 mEq/L (ref 19–32)
Calcium: 9.8 mg/dL (ref 8.4–10.5)
Chloride: 100 mEq/L (ref 96–112)
Creatinine, Ser: 0.71 mg/dL (ref 0.40–1.20)
GFR: 121.97 mL/min (ref >60.00)
Glucose, Bld: 65 mg/dL — ABNORMAL LOW (ref 70–99)
Potassium: 4.4 mEq/L (ref 3.5–5.1)
Sodium: 139 mEq/L (ref 135–145)
Total Bilirubin: 0.4 mg/dL (ref 0.2–1.2)
Total Protein: 7.2 g/dL (ref 6.0–8.3)

## 2021-08-25 LAB — IBC + FERRITIN
Ferritin: 37.6 ng/mL (ref 10.0–291.0)
Iron: 206 ug/dL — ABNORMAL HIGH (ref 42–145)
Saturation Ratios: 43.5 % (ref 20.0–50.0)
TIBC: 473.2 ug/dL — ABNORMAL HIGH (ref 250.0–450.0)
Transferrin: 338 mg/dL (ref 212.0–360.0)

## 2021-08-25 LAB — T4, FREE: Free T4: 0.79 ng/dL (ref 0.60–1.60)

## 2021-08-25 LAB — TSH: TSH: 1.16 u[IU]/mL (ref 0.35–5.50)

## 2021-08-25 NOTE — Patient Instructions (Addendum)
[phone]  [address]   [address]   [place] [address]     Pulmonary for sleep study home    Sleep Apnea Sleep apnea is a condition in which breathing pauses or becomes shallow during sleep. People with sleep apnea usually snore loudly. They may have times when they gasp and stop breathing for 10 seconds or more during sleep. This may happen many times during the night. Sleep apnea disrupts your sleep and keeps your body from getting the rest that it needs. This condition can increase your risk of certain health problems, including: Heart attack. Stroke. Obesity. Type 2 diabetes. Heart failure. Irregular heartbeat. High blood pressure. The goal of treatment is to help you breathe normally again. What are the causes? The most common cause of sleep apnea is a collapsed or blocked airway. There are three kinds of sleep apnea: Obstructive sleep apnea. This kind is caused by a blocked or collapsed airway. Central sleep apnea. This kind happens when the part of the brain that controls breathing does not send the correct signals to the muscles that control breathing. Mixed sleep apnea. This is a combination of obstructive and central sleep apnea. What increases the risk? You are more likely to develop this condition if you: Are overweight. Smoke. Have a smaller than normal airway. Are older. Are female. Drink alcohol. Take sedatives or tranquilizers. Have a family history of sleep apnea. Have a tongue or tonsils that are larger than normal. What are the signs or symptoms? Symptoms of this condition include: Trouble staying asleep. Loud snoring. Morning headaches. Waking up gasping. Dry mouth or sore throat in the morning. Daytime sleepiness and tiredness. If you have daytime fatigue because of sleep apnea, you may be more likely to have: Trouble concentrating. Forgetfulness. Irritability or mood swings. Personality changes. Feelings of depression. Sexual  dysfunction. This may include loss of interest if you are female, or erectile dysfunction if you are female. How is this diagnosed? This condition may be diagnosed with: A medical history. A physical exam. A series of tests that are done while you are sleeping (sleep study). These tests are usually done in a sleep lab, but they may also be done at home. How is this treated? Treatment for this condition aims to restore normal breathing and to ease symptoms during sleep. It may involve managing health issues that can affect breathing, such as high blood pressure or obesity. Treatment may include: Sleeping on your side. Using a decongestant if you have nasal congestion. Avoiding the use of depressants, including alcohol, sedatives, and narcotics. Losing weight if you are overweight. Making changes to your diet. Quitting smoking. Using a device to open your airway while you sleep, such as: An oral appliance. This is a custom-made mouthpiece that shifts your lower jaw forward. A continuous positive airway pressure (CPAP) device. This device blows air through a mask when you breathe out (exhale). A nasal expiratory positive airway pressure (EPAP) device. This device has valves that you put into each nostril. A bi-level positive airway pressure (BIPAP) device. This device blows air through a mask when you breathe in (inhale) and breathe out (exhale). Having surgery if other treatments do not work. During surgery, excess tissue is removed to create a wider airway. Follow these instructions at home: Lifestyle Make any lifestyle changes that your health care provider recommends. Eat a healthy, well-balanced diet. Take steps to lose weight if you are overweight. Avoid using depressants, including alcohol, sedatives, and narcotics. Do  not use any products that contain nicotine or tobacco. These products include cigarettes, chewing tobacco, and vaping devices, such as e-cigarettes. If you need help  quitting, ask your health care provider. General instructions Take over-the-counter and prescription medicines only as told by your health care provider. If you were given a device to open your airway while you sleep, use it only as told by your health care provider. If you are having surgery, make sure to tell your health care provider you have sleep apnea. You may need to bring your device with you. Keep all follow-up visits. This is important. Contact a health care provider if: The device that you received to open your airway during sleep is uncomfortable or does not seem to be working. Your symptoms do not improve. Your symptoms get worse. Get help right away if: You develop: Chest pain. Shortness of breath. Discomfort in your back, arms, or stomach. You have: Trouble speaking. Weakness on one side of your body. Drooping in your face. These symptoms may represent a serious problem that is an emergency. Do not wait to see if the symptoms will go away. Get medical help right away. Call your local emergency services (911 in the [place] Do not drive yourself to the hospital. Summary Sleep apnea is a condition in which breathing pauses or becomes shallow during sleep. The most common cause is a collapsed or blocked airway. The goal of treatment is to restore normal breathing and to ease symptoms during sleep. This information is not intended to replace advice given to you by your health care provider. Make sure you discuss any questions you have with your health care provider. Document Revised: 02/10/2021 Document Reviewed: 06/12/2020 Elsevier Patient Education  2022 [employer]  Insomnia Insomnia is a sleep disorder that makes it difficult to fall asleep or stay asleep. Insomnia can cause fatigue, low energy, difficulty concentrating, mood swings, and poor performance at work or school. There are three different ways to classify insomnia: Difficulty falling asleep. Difficulty  staying asleep. Waking up too early in the morning. Any type of insomnia can be long-term (chronic) or short-term (acute). Both are common. Short-term insomnia usually lasts for three months or less. Chronic insomnia occurs at least three times a week for longer than three months. What are the causes? Insomnia may be caused by another condition, situation, or substance, such as: Anxiety. Certain medicines. Gastroesophageal reflux disease (GERD) or other gastrointestinal conditions. Asthma or other breathing conditions. Restless legs syndrome, sleep apnea, or other sleep disorders. Chronic pain. Menopause. Stroke. Abuse of alcohol, tobacco, or illegal drugs. Mental health conditions, such as depression. Caffeine. Neurological disorders, such as Alzheimer's disease. An overactive thyroid (hyperthyroidism). Sometimes, the cause of insomnia may not be known. What increases the risk? Risk factors for insomnia include: Gender. Women are affected more often than men. Age. Insomnia is more common as you get older. Stress. Lack of exercise. Irregular work schedule or working night shifts. Traveling between different time zones. Certain medical and mental health conditions. What are the signs or symptoms? If you have insomnia, the main symptom is having trouble falling asleep or having trouble staying asleep. This may lead to other symptoms, such as: Feeling fatigued or having low energy. Feeling nervous about going to sleep. Not feeling rested in the morning. Having trouble concentrating. Feeling irritable, anxious, or depressed. How is this diagnosed? This condition may be diagnosed based on: Your symptoms and medical history. Your health care provider may ask about: Your sleep habits. Any  medical conditions you have. Your mental health. A physical exam. How is this treated? Treatment for insomnia depends on the cause. Treatment may focus on treating an underlying condition that is  causing insomnia. Treatment may also include: Medicines to help you sleep. Counseling or therapy. Lifestyle adjustments to help you sleep better. Follow these instructions at home: Eating and drinking  Limit or avoid alcohol, caffeinated beverages, and cigarettes, especially close to bedtime. These can disrupt your sleep. Do not eat a large meal or eat spicy foods right before bedtime. This can lead to digestive discomfort that can make it hard for you to sleep. Sleep habits  Keep a sleep diary to help you and your health care provider figure out what could be causing your insomnia. Write down: When you sleep. When you wake up during the night. How well you sleep. How rested you feel the next day. Any side effects of medicines you are taking. What you eat and drink. Make your bedroom a dark, comfortable place where it is easy to fall asleep. Put up shades or blackout curtains to block light from outside. Use a white noise machine to block noise. Keep the temperature cool. Limit screen use before bedtime. This includes: Watching TV. Using your smartphone, tablet, or computer. Stick to a routine that includes going to bed and waking up at the same times every day and night. This can help you fall asleep faster. Consider making a quiet activity, such as reading, part of your nighttime routine. Try to avoid taking naps during the day so that you sleep better at night. Get out of bed if you are still awake after 15 minutes of trying to sleep. Keep the lights down, but try reading or doing a quiet activity. When you feel sleepy, go back to bed. General instructions Take over-the-counter and prescription medicines only as told by your health care provider. Exercise regularly, as told by your health care provider. Avoid exercise starting several hours before bedtime. Use relaxation techniques to manage stress. Ask your health care provider to suggest some techniques that may work well for you.  These may include: Breathing exercises. Routines to release muscle tension. Visualizing peaceful scenes. Make sure that you drive carefully. Avoid driving if you feel very sleepy. Keep all follow-up visits as told by your health care provider. This is important. Contact a health care provider if: You are tired throughout the day. You have trouble in your daily routine due to sleepiness. You continue to have sleep problems, or your sleep problems get worse. Get help right away if: You have serious thoughts about hurting yourself or someone else. If you ever feel like you may hurt yourself or others, or have thoughts about taking your own life, get help right away. You can go to your nearest emergency department or call: Your local emergency services (911 in the [place] A suicide crisis helpline, such as the National Suicide Prevention Lifeline at 470 057 28171-(202)489-7163 or 988 in the [place] This is open 24 hours a day. Summary Insomnia is a sleep disorder that makes it difficult to fall asleep or stay asleep. Insomnia can be long-term (chronic) or short-term (acute). Treatment for insomnia depends on the cause. Treatment may focus on treating an underlying condition that is causing insomnia. Keep a sleep diary to help you and your health care provider figure out what could be causing your insomnia. This information is not intended to replace advice given to you by your health care provider. Make sure you discuss  any questions you have with your health care provider. Document Revised: 01/27/2021 Document Reviewed: 05/14/2020 Elsevier Patient Education  2022 [employer].
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
## 2021-08-25 NOTE — Telephone Encounter (Signed)
Lft pt vm to call ofc to sch [facility]. thanks ?

## 2021-08-26 LAB — PATHOLOGIST SMEAR REVIEW

## 2021-08-26 NOTE — Telephone Encounter (Signed)
Patient returned referrals phone call to set up ultra sound.

## 2021-09-01 ENCOUNTER — Other Ambulatory Visit: Payer: Self-pay

## 2021-09-01 ENCOUNTER — Ambulatory Visit
Admission: RE | Admit: 2021-09-01 | Discharge: 2021-09-01 | Disposition: A | Discharge status: Home / Self Care | Payer: BC Managed Care – PPO | Source: Ambulatory Visit | Attending: Internal Medicine | Admitting: Internal Medicine

## 2021-09-01 DIAGNOSIS — R590 Localized enlarged lymph nodes: Secondary | ICD-10-CM | POA: Insufficient documentation

## 2021-09-02 ENCOUNTER — Telehealth: Payer: Self-pay | Admitting: Internal Medicine

## 2021-09-02 NOTE — Telephone Encounter (Signed)
Patient last seen 08/25/21. Okay for referral?

## 2021-09-02 NOTE — Telephone Encounter (Signed)
Pt called back requesting a referral to [facility]. Please advise and Thank you!

## 2021-09-03 NOTE — Telephone Encounter (Signed)
Referral placed this was f/u to [place] pt had 08/2021 ok for ent referral  This was noted in result note to let me know if desired

## 2021-09-03 NOTE — Addendum Note (Signed)
Addended by: Quentin Ore on: 09/03/2021 03:12 PM   Modules accepted: Orders

## 2021-09-06 NOTE — Telephone Encounter (Signed)
Patient informed and verbalized understanding

## 2021-09-07 ENCOUNTER — Institutional Professional Consult (permissible substitution): Payer: BC Managed Care – PPO | Admitting: Primary Care

## 2021-09-17 NOTE — Telephone Encounter (Signed)
Noted  

## 2021-10-04 ENCOUNTER — Telehealth: Payer: Self-pay

## 2021-10-04 NOTE — Telephone Encounter (Signed)
Patient never had sleep study, states she has issues with sleeping. States her O2  Level drops to 85% when sleeping and never feels rested.  ?

## 2021-10-05 ENCOUNTER — Other Ambulatory Visit: Payer: Self-pay

## 2021-10-05 ENCOUNTER — Encounter: Payer: Self-pay | Admitting: Primary Care

## 2021-10-05 ENCOUNTER — Ambulatory Visit: Payer: BC Managed Care – PPO | Admitting: Primary Care

## 2021-10-05 VITALS — BP 102/72 | HR 56 | Temp 97.8°F | Ht 64.0 in | Wt 138.4 lb

## 2021-10-05 DIAGNOSIS — R400 Somnolence: Secondary | ICD-10-CM | POA: Diagnosis not present

## 2021-10-05 DIAGNOSIS — G4700 Insomnia, unspecified: Secondary | ICD-10-CM
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
## 2021-10-05 NOTE — Patient Instructions (Signed)
? ?Sleep apnea is defined as period of 10 seconds or longer when you stop breathing at night. This can happen multiple times a night. Dx sleep apnea is when this occurs more than 5 times an hour.  ?  ?Mild OSA 5-15 apneic events an hour ?Moderate OSA 15-30 apneic events an hour ?Severe OSA > 30 apneic events an hour ?  ?Untreated sleep apnea puts you at higher risk for cardiac arrhythmias, pulmonary HTN, stroke and diabetes ?  ?Treatment options include weight loss, side sleeping position, oral appliance, CPAP therapy or referral to ENT for possible surgical options  ?  ? ?Recommendations: ?- Try OTC diphenhydramine 25-50mg  at bedtime for insomnia (after you finish pure Z) ?- Push bedtime back 30-60 mins, you can start with small increments (aim to get between 6-8 hours of sleep a night) ?- Maintain regular sleep-wake schedule ?- Get out of bed if not able to fall asleep in 20-30 mins  ?- Ok to exercise ? ?Orders: ?- Home sleep study re: nocturnal hypoxia ? ?Follow-up: ?- 4-6 week virtual visit with Waynetta Sandy NP  ? ? ?Sleep Apnea ?Sleep apnea affects breathing during sleep. It causes breathing to stop for 10 seconds or more, or to become shallow. People with sleep apnea usually snore loudly. ?It can also increase the risk of: ?Heart attack. ?Stroke. ?Being very overweight (obese). ?Diabetes. ?Heart failure. ?Irregular heartbeat. ?High blood pressure. ?The goal of treatment is to help you breathe normally again. ?What are the causes? ?The most common cause of this condition is a collapsed or blocked airway. ?There are three kinds of sleep apnea: ?Obstructive sleep apnea. This is caused by a blocked or collapsed airway. ?Central sleep apnea. This happens when the brain does not send the right signals to the muscles that control breathing. ?Mixed sleep apnea. This is a combination of obstructive and central sleep apnea. ?What increases the risk? ?Being overweight. ?Smoking. ?Having a small airway. ?Being older. ?Being  female. ?Drinking alcohol. ?Taking medicines to calm yourself (sedatives or tranquilizers). ?Having family members with the condition. ?Having a tongue or tonsils that are larger than normal. ?What are the signs or symptoms? ?Trouble staying asleep. ?Loud snoring. ?Headaches in the morning. ?Waking up gasping. ?Dry mouth or sore throat in the morning. ?Being sleepy or tired during the day. ?If you are sleepy or tired during the day, you may also: ?Not be able to focus your mind (concentrate). ?Forget things. ?Get angry a lot and have mood swings. ?Feel sad (depressed). ?Have changes in your personality. ?Have less interest in sex, if you are female. ?Be unable to have an erection, if you are female. ?How is this treated? ? ?Sleeping on your side. ?Using a medicine to get rid of mucus in your nose (decongestant). ?Avoiding the use of alcohol, medicines to help you relax, or certain pain medicines (narcotics). ?Losing weight, if needed. ?Changing your diet. ?Quitting smoking. ?Using a machine to open your airway while you sleep, such as: ?An oral appliance. This is a mouthpiece that shifts your lower jaw forward. ?A CPAP device. This device blows air through a mask when you breathe out (exhale). ?An EPAP device. This has valves that you put in each nostril. ?A BIPAP device. This device blows air through a mask when you breathe in (inhale) and breathe out. ?Having surgery if other treatments do not work. ?Follow these instructions at home: ?Lifestyle ?Make changes that your doctor recommends. ?Eat a healthy diet. ?Lose weight if needed. ?Avoid alcohol, medicines to  help you relax, and some pain medicines. ?Do not smoke or use any products that contain nicotine or tobacco. If you need help quitting, ask your doctor. ?General instructions ?Take over-the-counter and prescription medicines only as told by your doctor. ?If you were given a machine to use while you sleep, use it only as told by your doctor. ?If you are having  surgery, make sure to tell your doctor you have sleep apnea. You may need to bring your device with you. ?Keep all follow-up visits. ?Contact a doctor if: ?The machine that you were given to use during sleep bothers you or does not seem to be working. ?You do not get better. ?You get worse. ?Get help right away if: ?Your chest hurts. ?You have trouble breathing in enough air. ?You have an uncomfortable feeling in your back, arms, or stomach. ?You have trouble talking. ?One side of your body feels weak. ?A part of your face is hanging down. ?These symptoms may be an emergency. Get help right away. Call your local emergency services (911 in the [place] ?Do not wait to see if the symptoms will go away. ?Do not drive yourself to the hospital. ?Summary ?This condition affects breathing during sleep. ?The most common cause is a collapsed or blocked airway. ?The goal of treatment is to help you breathe normally while you sleep. ?This information is not intended to replace advice given to you by your health care provider. Make sure you discuss any questions you have with your health care provider. ?Document Revised: 02/10/2021 Document Reviewed: 06/12/2020 ?Elsevier Patient Education ? 2022 [employer] ? ? ?Insomnia ?Insomnia is a sleep disorder that makes it difficult to fall asleep or stay asleep. Insomnia can cause fatigue, low energy, difficulty concentrating, mood swings, and poor performance at work or school. ?There are three different ways to classify insomnia: ?Difficulty falling asleep. ?Difficulty staying asleep. ?Waking up too early in the morning. ?Any type of insomnia can be long-term (chronic) or short-term (acute). Both are common. Short-term insomnia usually lasts for three months or less. Chronic insomnia occurs at least three times a week for longer than three months. ?What are the causes? ?Insomnia may be caused by another condition, situation, or substance, such as: ?Anxiety. ?Certain  medicines. ?Gastroesophageal reflux disease (GERD) or other gastrointestinal conditions. ?Asthma or other breathing conditions. ?Restless legs syndrome, sleep apnea, or other sleep disorders. ?Chronic pain. ?Menopause. ?Stroke. ?Abuse of alcohol, tobacco, or illegal drugs. ?Mental health conditions, such as depression. ?Caffeine. ?Neurological disorders, such as Alzheimer's disease. ?An overactive thyroid (hyperthyroidism). ?Sometimes, the cause of insomnia may not be known. ?What increases the risk? ?Risk factors for insomnia include: ?Gender. Women are affected more often than men. ?Age. Insomnia is more common as you get older. ?Stress. ?Lack of exercise. ?Irregular work schedule or working night shifts. ?Traveling between different time zones. ?Certain medical and mental health conditions. ?What are the signs or symptoms? ?If you have insomnia, the main symptom is having trouble falling asleep or having trouble staying asleep. This may lead to other symptoms, such as: ?Feeling fatigued or having low energy. ?Feeling nervous about going to sleep. ?Not feeling rested in the morning. ?Having trouble concentrating. ?Feeling irritable, anxious, or depressed. ?How is this diagnosed? ?This condition may be diagnosed based on: ?Your symptoms and medical history. Your health care provider may ask about: ?Your sleep habits. ?Any medical conditions you have. ?Your mental health. ?A physical exam. ?How is this treated? ?Treatment for insomnia depends on the cause. Treatment may  focus on treating an underlying condition that is causing insomnia. Treatment may also include: ?Medicines to help you sleep. ?Counseling or therapy. ?Lifestyle adjustments to help you sleep better. ?Follow these instructions at home: ?Eating and drinking ? ?Limit or avoid alcohol, caffeinated beverages, and cigarettes, especially close to bedtime. These can disrupt your sleep. ?Do not eat a large meal or eat spicy foods right before bedtime. This  can lead to digestive discomfort that can make it hard for you to sleep. ?Sleep habits ? ?Keep a sleep diary to help you and your health care provider figure out what could be causing your insomnia. Write down: ?When you slee

## 2021-10-15 ENCOUNTER — Telehealth: Payer: Self-pay | Admitting: Primary Care

## 2021-10-15 DIAGNOSIS — G4700 Insomnia, unspecified: Secondary | ICD-10-CM

## 2021-10-15 DIAGNOSIS — R400 Somnolence: Secondary | ICD-10-CM

## 2021-10-15 HISTORY — DX: Somnolence: R40.0

## 2021-10-15 HISTORY — DX: Insomnia, unspecified: G47.00

## 2021-10-15 NOTE — Assessment & Plan Note (Addendum)
-   Patient has symptoms of insomnia, restless sleep, daytime sleepiness and nocturnal hypoxia. Symptoms have been occurring every night for the last several month. Suspicion patient could have sleep apnea, needs HST to evaluate. Advised patient to avoid driving if experiencing excessive daytime sleepiness. Focus on side sleeping position. FU in 4-6 weeks to review sleep study results and treatment options if needed.  ?

## 2021-10-15 NOTE — Telephone Encounter (Signed)
Do you want contact for CBT for insomnia  ?

## 2021-10-15 NOTE — Assessment & Plan Note (Addendum)
-   Trouble initiang and maintaining sleep. Recommend trying OTC diphenhydramine 25-50mg  at bedtime for insomnia. Sleep hygiene reviewed. Advised she try pushing back bedtime 30-60 mins, aim to get between 6-8 hours of sleep a night. Recommend she keep regular sleep-wake schedule and get regular exercise. If still having difficult can try either Doxepin or Trazodone. She would also benefit from CBT for insomnia.  ?

## 2021-10-19 NOTE — Telephone Encounter (Signed)
Thank you Dr. Annamaria Boots lol  ?Raquel Sarna- can you please pass contact information along to patient for CBT - insomnia

## 2021-10-19 NOTE — Telephone Encounter (Signed)
I'm sorry, I meant to ask if you had a contact for CBT for insomnia? I wanted to give it to one of my patients  ?Poor typing skills

## 2021-10-19 NOTE — Telephone Encounter (Signed)
Called and spoke with pt letting her know the info from CY and she verbalized understanding. Nothing further needed. 

## 2021-11-09 ENCOUNTER — Ambulatory Visit: Payer: BC Managed Care – PPO

## 2021-11-09 DIAGNOSIS — R0683 Snoring: Secondary | ICD-10-CM

## 2021-11-09 DIAGNOSIS — R400 Somnolence: Secondary | ICD-10-CM

## 2021-11-10 DIAGNOSIS — R0683 Snoring: Secondary | ICD-10-CM

## 2021-11-23 ENCOUNTER — Telehealth (INDEPENDENT_AMBULATORY_CARE_PROVIDER_SITE_OTHER): Payer: BC Managed Care – PPO | Admitting: Primary Care

## 2021-11-23 DIAGNOSIS — G4700 Insomnia, unspecified: Secondary | ICD-10-CM

## 2021-11-23 NOTE — Progress Notes (Signed)
Virtual Visit via Video Note ? ?I connected with Amanda Finley on 11/23/21 at 12:00 PM EDT by a video enabled telemedicine application and verified that I am speaking with the correct person using two identifiers. ? ?Location: ?Patient: Home ?Provider: Office ?  ?I discussed the limitations of evaluation and management by telemedicine and the availability of in person appointments. The patient expressed understanding and agreed to proceed. ? ?History of Present Illness: ?21 year old female, never smoked.  Past medical history significant for acne vulgaris, leukopenia, scoliosis.  ? ?Previous [facility] pulmonary encounter: ?10/05/2021 ?Patient referred for sleep consult by PCP d.t insomnia and restless sleep. She is a very light sleeper. She has tried black out curtains, sound machine and ear plugs. She has some associated anxiety with school. She has noticed her O2 level drops to mid 80s on her watch while sleeping. She goes to bed 8pm, she has tried pushing her bedtime later but having difficulty sleeping. Throughout the day she feels she is gasping for air. She is working on meditation which has been helping. Her boyfriend thinks she may have anxiety. She has a hard time sleeping d.t racing throughts. She will wake up at 3am. Her father has sleep apnea. Her apple watch has shown her O2 level will drop at night. Primary care recommended she try taking melatonin 3-10 mg at bedtime for insomnia symptoms. She has been taking pure Z which does help for a short period. She will sleep 6 hours and feels well rested. 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 ?Sleep questionnaire ?Symptoms-  nocturnal hypoxia, insomnia, restless sleep, daytime sleepiness ?Prior sleep study-  None  ?Bedtime- 9-10pm ?Time to fall asleep-  10-21mins ?Nocturnal awakenings- once ?Out of bed/start of day- 6-7am  ?Weight changes- 15 lbs  ?Do you operate heavy machinery- No ?Do you currently wear CPAP-No ?Do you current  wear oxygen- No ?Epworth-0 ? ?11/23/2021- Interim hx  ?Patient contacted today to review sleep study results. HST on 11/09/21 showed several obstructive respiratory events, however, these were not frequent enough to qualify for diagnosis of obstructive sleep apnea. Overall AHI was 3/hr with SpO2 low 91%. We reviewed results today. She has been practicing meditation to help with anxiety and taking diphenhydramine 50mg  at bedtime for insomnia. Her sleep is noticeably better. She still has issues falling asleep when staying somewhere new like a hotel room. During last visit we gave patient contact information or CBT for insomnia.  ?  ?Observations/Objective: ? ?- Appears well; No respiratory symptoms  ? ? ?Assessment and Plan: ? ?Insomnia: ?- Improved with meditation and prn diphenhydramine 50mg  at bedtime  ?- Patient has been given information for CBT  ? ?Daytime sleepiness: ?- HST in April 2023 showed no evidence of OSA, she had several apneas but did not meet dx for sleep apnea ?- Encourage side sleeping position or elevated head of bed while sleeping to decrease risk of apnea. Advised against driving if experiencing excessive daytime sleepiness.  ? ?Nocturnal hypoxia: ?- No evidence of hypoxia on sleep study, Spo2 low 91% RA  ?- No intervention needed  ? ?Follow Up Instructions: ? ? Follow-up as needed  ? ?I discussed the assessment and treatment plan with the patient. The patient was provided an opportunity to ask questions and all were answered. The patient agreed with the plan and demonstrated an understanding of the instructions. ?  ?The patient was advised to call back or seek an in-person evaluation if the symptoms worsen  or if the condition fails to improve as anticipated. ? ?I provided 20 minutes of non-face-to-face time during this encounter. ? ? ?Glenford Bayley, NP ? ?

## 2021-11-23 NOTE — Patient Instructions (Addendum)
HST on 11/09/21 showed several obstructive respiratory events, however, these were not frequent enough to qualify for diagnosis of obstructive sleep apnea. Overall AHI was 3/hr with SpO2 low 91% ? ?Encourage side sleeping position or elevated head of bed while sleeping to decrease risk of apnea. Advised against driving if experiencing excessive daytime sleepiness.  ? ?Follow-up as needed  ?

## 2021-11-29 ENCOUNTER — Encounter: Payer: Self-pay | Admitting: Internal Medicine

## 2021-11-29 ENCOUNTER — Other Ambulatory Visit: Payer: Self-pay | Admitting: Internal Medicine

## 2021-11-29 DIAGNOSIS — J4520 Mild intermittent asthma, uncomplicated: Secondary | ICD-10-CM

## 2021-11-29 DIAGNOSIS — J45909 Unspecified asthma, uncomplicated: Secondary | ICD-10-CM | POA: Insufficient documentation

## 2021-11-29 MED ORDER — ALBUTEROL SULFATE HFA 108 (90 BASE) MCG/ACT IN AERS: 1.0000 | INHALATION_SPRAY | Freq: Four times a day (QID) | RESPIRATORY_TRACT | 11 refills | Status: DC | PRN

## 2022-01-25 ENCOUNTER — Encounter: Payer: Self-pay | Admitting: Internal Medicine

## 2022-02-02 IMAGING — US US BREAST*L* LIMITED INC AXILLA
1 series · 2 of 2 positions shown · non-contrast
Comparison: Previous exam(s).

CLINICAL DATA: Focal pain recently felt in the left breast. The
pain has resolved. The patient's mother was diagnosed with breast
cancer at 48 for 49 years old. The patient is unaware of any other
pertinent family history.

EXAM:
ULTRASOUND OF THE LEFT BREAST

[Series 1: us breast*left* limited inc axilla · 0.06mm/px · 2 of 2 slices shown]
[im 1/2]
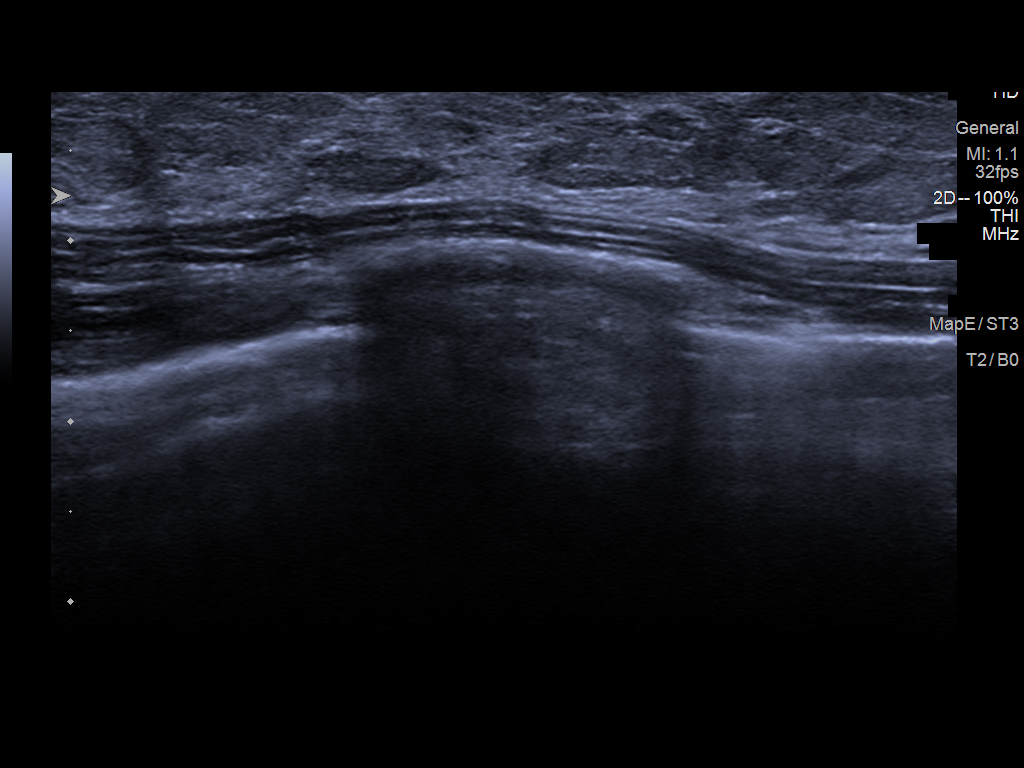
[im 2/2]
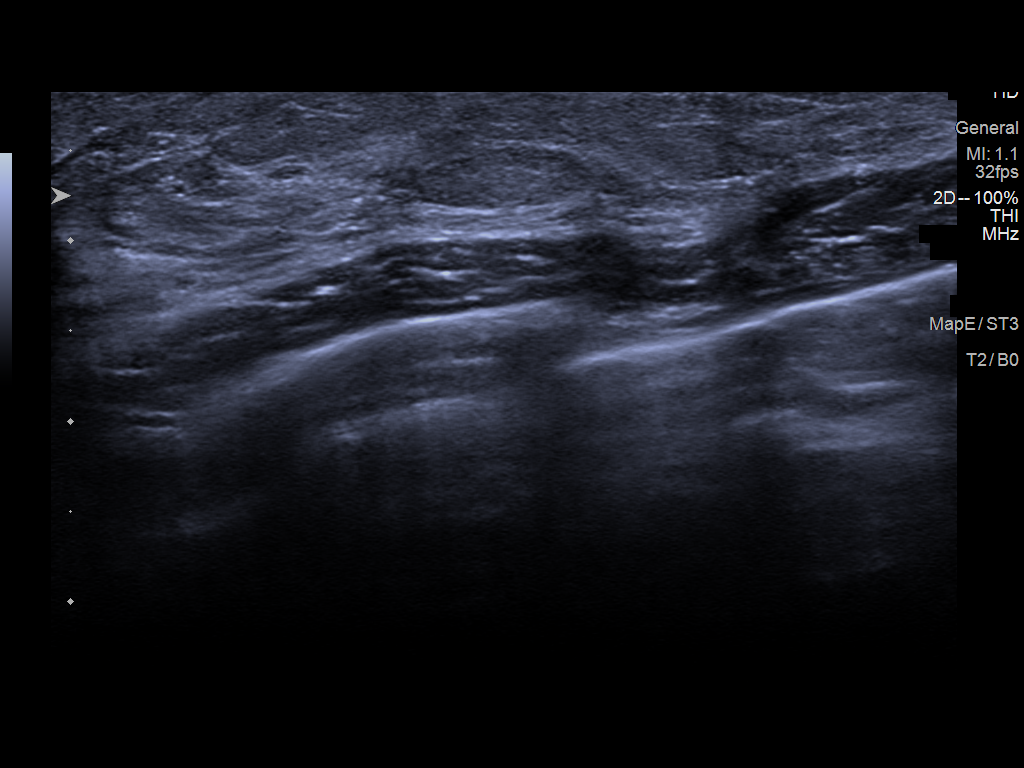

[2 of 2 positions shown; findings below may reference images not displayed]

FINDINGS: On physical exam, no suspicious lumps are identified.

Targeted ultrasound is performed, showing no sonographic
abnormalities.
IMPRESSION: No sonographic evidence of malignancy.

RECOMMENDATION:
Treatment of the patient's symptoms should be based on clinical and
physical exam given lack of imaging findings.

Recommend annual screening mammography beginning at the age of 38 or
39, 10 years after the age her mother was diagnosed with breast
cancer. If the patient were to have another first or second degree
relative diagnosed with breast cancer, recommend genetic counseling
based on NCCN guidelines.

I have discussed the findings and recommendations with the patient.
If applicable, a reminder letter will be sent to the patient
regarding the next appointment.

BI-RADS CATEGORY  1: Negative.

## 2022-02-08 ENCOUNTER — Encounter: Payer: BC Managed Care – PPO | Admitting: Internal Medicine

## 2022-02-09 ENCOUNTER — Encounter: Payer: BC Managed Care – PPO | Admitting: Internal Medicine

## 2022-02-17 ENCOUNTER — Other Ambulatory Visit (HOSPITAL_COMMUNITY)
Admission: RE | Admit: 2022-02-17 | Discharge: 2022-02-17 | Disposition: A | Discharge status: Home / Self Care | Payer: BC Managed Care – PPO | Source: Ambulatory Visit | Attending: Internal Medicine | Admitting: Internal Medicine

## 2022-02-17 ENCOUNTER — Ambulatory Visit (INDEPENDENT_AMBULATORY_CARE_PROVIDER_SITE_OTHER): Payer: BC Managed Care – PPO | Admitting: Internal Medicine

## 2022-02-17 ENCOUNTER — Encounter: Payer: Self-pay | Admitting: Internal Medicine

## 2022-02-17 VITALS — BP 110/80 | HR 63 | Temp 97.9°F | Ht 65.0 in | Wt 141.1 lb

## 2022-02-17 DIAGNOSIS — Z124 Encounter for screening for malignant neoplasm of cervix: Secondary | ICD-10-CM | POA: Diagnosis not present

## 2022-02-17 DIAGNOSIS — Z0000 Encounter for general adult medical examination without abnormal findings: Secondary | ICD-10-CM

## 2022-02-17 DIAGNOSIS — Z13220 Encounter for screening for lipoid disorders: Secondary | ICD-10-CM

## 2022-02-17 DIAGNOSIS — Z1329 Encounter for screening for other suspected endocrine disorder: Secondary | ICD-10-CM

## 2022-02-17 DIAGNOSIS — J4520 Mild intermittent asthma, uncomplicated: Secondary | ICD-10-CM | POA: Diagnosis not present

## 2022-02-17 DIAGNOSIS — Z23 Encounter for immunization: Secondary | ICD-10-CM

## 2022-02-17 DIAGNOSIS — Z0184 Encounter for antibody response examination: Secondary | ICD-10-CM

## 2022-02-17 DIAGNOSIS — Z1389 Encounter for screening for other disorder: Secondary | ICD-10-CM

## 2022-02-17 LAB — IBC + FERRITIN
Ferritin: 26 ng/mL (ref 10.0–291.0)
Iron: 77 ug/dL (ref 42–145)
Saturation Ratios: 14.4 % — ABNORMAL LOW (ref 20.0–50.0)
TIBC: 536.2 ug/dL — ABNORMAL HIGH (ref 250.0–450.0)
Transferrin: 383 mg/dL — ABNORMAL HIGH (ref 212.0–360.0)

## 2022-02-17 MED ORDER — ALBUTEROL SULFATE HFA 108 (90 BASE) MCG/ACT IN AERS: 1.0000 | INHALATION_SPRAY | Freq: Four times a day (QID) | RESPIRATORY_TRACT | 11 refills | Status: AC | PRN

## 2022-02-17 MED ORDER — TETANUS-DIPHTH-ACELL PERTUSSIS 5-2.5-18.5 LF-MCG/0.5 IM SUSP: 0.5000 mL | Freq: Once | INTRAMUSCULAR | 0 refills | Status: AC

## 2022-02-17 MED ORDER — FEXOFENADINE HCL 180 MG PO TABS: 180.0000 mg | ORAL_TABLET | Freq: Every day | ORAL | 3 refills | Status: AC

## 2022-02-17 NOTE — Patient Instructions (Signed)
Glucose tablets   Hypoglycemia Hypoglycemia occurs when the level of sugar (glucose) in the blood is too low. Hypoglycemia can happen in people who have or do not have diabetes. It can develop quickly, and it can be a medical emergency. For most people, a blood glucose level below 70 mg/dL (3.9 mmol/L) is considered hypoglycemia. Glucose is a type of sugar that provides the body's main source of energy. Certain hormones (insulin and glucagon) control the level of glucose in the blood. Insulin lowers blood glucose, and glucagon raises blood glucose. Hypoglycemia can result from having too much insulin in the bloodstream, or from not eating enough food that contains glucose. You may also have reactive hypoglycemia, which happens within 4 hours after eating a meal. What are the causes? Hypoglycemia occurs most often in people who have diabetes and may be caused by: Diabetes medicine. Not eating enough, or not eating often enough. Increased physical activity. Drinking alcohol on an empty stomach. If you do not have diabetes, hypoglycemia may be caused by: A tumor in the pancreas. Not eating enough, or not eating for long periods at a time (fasting). A severe infection or illness. Problems after having bariatric surgery. Organ failure, such as kidney or liver failure. Certain medicines. What increases the risk? Hypoglycemia is more likely to develop in people who: Have diabetes and take medicines to lower blood glucose. Abuse alcohol. Have a severe illness. What are the signs or symptoms? Symptoms vary depending on whether the condition is mild, moderate, or severe. Mild hypoglycemia Hunger. Sweating and feeling clammy. Dizziness or feeling light-headed. Sleepiness or restless sleep. Nausea. Increased heart rate. Headache. Blurry vision. Mood changes, such as irritability or anxiety. Tingling or numbness around the mouth, lips, or tongue. Moderate hypoglycemia Confusion and poor  judgment. Behavior changes. Weakness. Irregular heartbeat. A change in coordination. Severe hypoglycemia Severe hypoglycemia is a medical emergency. It can cause: Fainting. Seizures. Loss of consciousness (coma). Death. How is this diagnosed? Hypoglycemia is diagnosed with a blood test to measure your blood glucose level. This blood test is done while you are having symptoms. Your health care provider may also do a physical exam and review your medical history. How is this treated? This condition can be treated by immediately eating or drinking something that contains sugar with 15 grams of fast-acting carbohydrate, such as: 4 oz (120 mL) of fruit juice. 4 oz (120 mL) of regular soda (not diet soda). Several pieces of hard candy. Check food labels to find out how many pieces to eat for 15 grams. 1 Tbsp (15 mL) of sugar or honey. 4 glucose tablets. 1 tube of glucose gel. Treating hypoglycemia if you have diabetes If you are alert and able to swallow safely, follow the 15:15 rule: Take 15 grams of a fast-acting carbohydrate. Talk with your health care provider about how much you should take. Options for getting 15 grams of fast-acting carbohydrate include: Glucose tablets (take 4 tablets). Several pieces of hard candy. Check food labels to find out how many pieces to eat for 15 grams. 4 oz (120 mL) of fruit juice. 4 oz (120 mL) of regular soda (not diet soda). 1 Tbsp (15 mL) of sugar or honey. 1 tube of glucose gel. Check your blood glucose 15 minutes after you take the carbohydrate. If the repeat blood glucose level is still at or below 70 mg/dL (3.9 mmol/L), take 15 grams of a carbohydrate again. If your blood glucose level does not increase above 70 mg/dL (3.9 mmol/L) after  3 tries, seek emergency medical care. After your blood glucose level returns to normal, eat a meal or a snack within 1 hour.  Treating severe hypoglycemia Severe hypoglycemia is when your blood glucose level  is below 54 mg/dL (3 mmol/L). Severe hypoglycemia is a medical emergency. Get medical help right away. If you have severe hypoglycemia and you cannot eat or drink, you will need to be given glucagon. A family member or close friend should learn how to check your blood glucose and how to give you glucagon. Ask your health care provider if you need to have an emergency glucagon kit available. Severe hypoglycemia may need to be treated in a hospital. The treatment may include getting glucose through an IV. You may also need treatment for the cause of your hypoglycemia. Follow these instructions at home:  General instructions Take over-the-counter and prescription medicines only as told by your health care provider. Monitor your blood glucose as told by your health care provider. If you drink alcohol: Limit how much you have to: 0-1 drink a day for women who are not pregnant. 0-2 drinks a day for men. Know how much alcohol is in your drink. In the [place] one drink equals one 12 oz bottle of beer (355 mL), one 5 oz glass of wine (148 mL), or one 1 oz glass of hard liquor (44 mL). Be sure to eat food along with drinking alcohol. Be aware that alcohol is absorbed quickly and may have lingering effects that may result in hypoglycemia later. Be sure to do ongoing glucose monitoring. Keep all follow-up visits. This is important. If you have diabetes: Always have a fast-acting carbohydrate (15 grams) option with you to treat low blood glucose. Follow your diabetes management plan as directed by your health care provider. Make sure you: Know the symptoms of hypoglycemia. It is important to treat it right away to prevent it from becoming severe. Check your blood glucose as often as told. Always check before and after exercise. Always check your blood glucose before you drive a motorized vehicle. Take your medicines as told. Follow your meal plan. Eat on time, and do not skip meals. Share your diabetes  management plan with people in your workplace, school, and household. Carry a medical alert card or wear medical alert jewelry. Where to find more information American Diabetes Association: www.diabetes.org Contact a health care provider if: You have problems keeping your blood glucose in your target range. You have frequent episodes of hypoglycemia. Get help right away if: You continue to have hypoglycemia symptoms after eating or drinking something that contains 15 grams of fast-acting carbohydrate, and you cannot get your blood glucose above 70 mg/dL (3.9 mmol/L) while following the 15:15 rule. Your blood glucose is below 54 mg/dL (3 mmol/L). You have a seizure. You faint. These symptoms may represent a serious problem that is an emergency. Do not wait to see if the symptoms will go away. Get medical help right away. Call your local emergency services (911 in the [place] Do not drive yourself to the hospital. Summary Hypoglycemia occurs when the level of sugar (glucose) in the blood is too low. Hypoglycemia can happen in people who have or do not have diabetes. It can develop quickly, and it can be a medical emergency. Make sure you know the symptoms of hypoglycemia and how to treat it. Always have a fast-acting carbohydrate option with you to treat low blood sugar. This information is not intended to replace advice given to you by  your health care provider. Make sure you discuss any questions you have with your health care provider. Document Revised: 06/04/2020 Document Reviewed: 06/04/2020 Elsevier Patient Education  [place].
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
## 2022-02-18 ENCOUNTER — Ambulatory Visit (INDEPENDENT_AMBULATORY_CARE_PROVIDER_SITE_OTHER): Payer: Self-pay

## 2022-02-18 DIAGNOSIS — Z23 Encounter for immunization: Secondary | ICD-10-CM

## 2022-02-18 LAB — HEPATITIS B SURFACE ANTIBODY, QUANTITATIVE: Hep B S AB Quant (Post): 367 m[IU]/mL (ref >=10)

## 2022-02-18 NOTE — Progress Notes (Signed)
Tdap updated

## 2022-02-21 LAB — CYTOLOGY - PAP
Chlamydia: NEGATIVE
Comment: NEGATIVE
Comment: NEGATIVE
Comment: NORMAL
Diagnosis: NEGATIVE
High risk HPV: NEGATIVE
Neisseria Gonorrhea: NEGATIVE

## 2022-04-28 ENCOUNTER — Ambulatory Visit: Payer: BC Managed Care – PPO | Admitting: Family Medicine

## 2022-04-28 ENCOUNTER — Encounter: Payer: Self-pay | Admitting: Family Medicine

## 2022-04-28 VITALS — BP 122/76 | HR 73 | Temp 97.8°F | Ht 65.0 in | Wt 142.4 lb

## 2022-04-28 DIAGNOSIS — Z8719 Personal history of other diseases of the digestive system: Secondary | ICD-10-CM

## 2022-04-28 DIAGNOSIS — Z111 Encounter for screening for respiratory tuberculosis: Secondary | ICD-10-CM

## 2022-04-28 DIAGNOSIS — Z789 Other specified health status: Secondary | ICD-10-CM

## 2022-04-28 DIAGNOSIS — Z020 Encounter for examination for admission to educational institution: Secondary | ICD-10-CM

## 2022-04-28 DIAGNOSIS — R7989 Other specified abnormal findings of blood chemistry: Secondary | ICD-10-CM | POA: Diagnosis not present

## 2022-04-28 DIAGNOSIS — Z114 Encounter for screening for human immunodeficiency virus [HIV]: Secondary | ICD-10-CM

## 2022-04-28 DIAGNOSIS — Z113 Encounter for screening for infections with a predominantly sexual mode of transmission: Secondary | ICD-10-CM

## 2022-04-28 DIAGNOSIS — Z1159 Encounter for screening for other viral diseases: Secondary | ICD-10-CM

## 2022-04-28 NOTE — Patient Instructions (Addendum)
It was a pleasure meeting you today. Thank you for allowing me to take part in your health care.  Our goals for today as we discussed include:  For your iron levels Will repeat levels in April 2024 Stop multivitamins  For your constipation Now that you are off the iron tablets, recommend to stop multivitamins as this also contains iron Can continue Miralax but wean to every other day for a few days, then every three days and so forth until discontinued.  Can use prune juice, OJ daily.  Continue to increase soluble fiber in diet and exercise.  Will complete forms when results from lab work received.  I will have CMA call you when forms are ready for pick up.  Will discuss adding Folic acid at your next visit.  Please follow-up with PCP in April 2024  If you have any questions or concerns, please do not hesitate to call the office at [phone].  I look forward to our next visit and until then take care and stay safe.  Regards,   Carollee Leitz, MD   [facility]

## 2022-04-28 NOTE — Progress Notes (Signed)
    SUBJECTIVE:   CHIEF COMPLAINT / HPI: transfer of care  Patient presents to clinic to transfer care.  No acute concerns  Requesting forms to be completed for PT school application.    Constipation Chronic issue with constipation since childhood.  Was started on Miralax and has continued throughout adulthood.  Was wondering needing continue medication.  Nutrition good, lots of fruits and vegetables. Drinks plenty of water. Exercises regularly.  Had stopped iron supplements as Ferritin was elevated.  Continues to take Multivitamins.  Reports no straining with daily BM.  Denies any bloody stool or change in bowel habits.    PERTINENT  PMH / PSH:  Anemia Elevated Ferritin Constipation  OBJECTIVE:   BP 122/76 (BP Location: Left Arm, Patient Position: Sitting, Cuff Size: Normal)   Pulse 73   Temp 97.8 F (36.6 C) (Oral)   Ht 5\' 5"  (1.651 m)   Wt 142 lb 6.4 oz (64.6 kg)   LMP 04/28/2022   SpO2 99%   BMI 23.70 kg/m    General: Alert, no acute distress Cardio: Normal S1 and S2, RRR, no r/m/g Pulm: CTAB, normal work of breathing Abdomen: Bowel sounds normal. Abdomen soft and non-tender.  Extremities: No peripheral edema.    ASSESSMENT/PLAN:   H/O constipation Suspect increase in iron supplements causing some element of abdominal discomfort when not taking Miralax.   Discontinue multivitamins that contain iron.   Can continue Miralax as needed.  Continue current diet and exercise Follow up with PCP in 6 months or sooner if symptoms worsen   School health examination Requires blood work today. Physical completed and normal healthy female adult. Labs today- RPR, TB quant, Varicella titre, Hep B, Hep C and HIV Immunizations UTD Forms to be completed once results reviewed. Will have CMA call patient to pick up forms when ready  Elevated ferritin level Reviewed previous labs showing elevated Ferritin levels.  Stopped iron supplements 8 months ago.  Labs drawn and  remained elevated.  Patient still taking Multivitamins containing iron. -Discontinue Multivitamins -Repeat labs in 6 months, if remains elevated will need further evaluation    HCM PAP NILM, HPV negative 02/2022  PDMP Reviewed  Carollee Leitz, MD

## 2022-04-29 LAB — HEPATITIS B SURFACE ANTIBODY,QUALITATIVE: Hep B S Ab: REACTIVE — AB

## 2022-05-02 ENCOUNTER — Ambulatory Visit: Payer: Self-pay

## 2022-05-02 DIAGNOSIS — Z23 Encounter for immunization: Secondary | ICD-10-CM

## 2022-05-02 DIAGNOSIS — Z111 Encounter for screening for respiratory tuberculosis: Secondary | ICD-10-CM

## 2022-05-03 ENCOUNTER — Telehealth: Payer: Self-pay

## 2022-05-03 ENCOUNTER — Encounter: Payer: Self-pay | Admitting: Family Medicine

## 2022-05-03 ENCOUNTER — Other Ambulatory Visit: Payer: Self-pay | Admitting: Family Medicine

## 2022-05-03 DIAGNOSIS — Z020 Encounter for examination for admission to educational institution: Secondary | ICD-10-CM | POA: Insufficient documentation

## 2022-05-03 DIAGNOSIS — Z0279 Encounter for issue of other medical certificate: Secondary | ICD-10-CM

## 2022-05-03 DIAGNOSIS — Z8719 Personal history of other diseases of the digestive system: Secondary | ICD-10-CM | POA: Insufficient documentation

## 2022-05-03 DIAGNOSIS — R7989 Other specified abnormal findings of blood chemistry: Secondary | ICD-10-CM | POA: Insufficient documentation

## 2022-05-03 NOTE — Assessment & Plan Note (Signed)
Requires blood work today. Physical completed and normal healthy female adult. Labs today- RPR, TB quant, Varicella titre, Hep B, Hep C and HIV Immunizations UTD Forms to be completed once results reviewed. Will have CMA call patient to pick up forms when ready

## 2022-05-03 NOTE — Telephone Encounter (Signed)
Patient's immunizations for [facility]ley is filled out and put in your quick sign folder. I will call her and let her know it is ready for pick up after you sign.

## 2022-05-03 NOTE — Telephone Encounter (Signed)
Spoke to Patient regarding her needing a Varicella Booster and repeat test 4 weeks after. Patient is calling [facility] to schedule. Patient will also pick up her forms for [facility].

## 2022-05-03 NOTE — Assessment & Plan Note (Signed)
Reviewed previous labs showing elevated Ferritin levels.  Stopped iron supplements 8 months ago.  Labs drawn and remained elevated.  Patient still taking Multivitamins containing iron. -Discontinue Multivitamins -Repeat labs in 6 months, if remains elevated will need further evaluation

## 2022-05-03 NOTE — Assessment & Plan Note (Signed)
Suspect increase in iron supplements causing some element of abdominal discomfort when not taking Miralax.   Discontinue multivitamins that contain iron.   Can continue Miralax as needed.  Continue current diet and exercise Follow up with PCP in 6 months or sooner if symptoms worsen

## 2022-05-03 NOTE — Progress Notes (Signed)
Addendum: Varicella levels low.  Will need Varicella booster and repeat titre 4 weeks after injection. Recommend that patient get vaccine at [facility], [facility] Department or can schedule RN appointment at [facility] office.  CMA to notify patient.  Carollee Leitz, MD

## 2022-05-04 LAB — QUANTIFERON-TB GOLD PLUS
Mitogen-NIL: >10 IU/mL
NIL: 0.03 IU/mL
QuantiFERON-TB Gold Plus: NEGATIVE
TB1-NIL: <0 IU/mL
TB2-NIL: 0 IU/mL

## 2022-05-04 LAB — RPR: RPR Ser Ql: NONREACTIVE

## 2022-05-04 LAB — VARICELLA ZOSTER ANTIBODY, IGG: Varicella IgG: <135 index — ABNORMAL LOW

## 2022-05-04 LAB — HEPATITIS C ANTIBODY: Hepatitis C Ab: NONREACTIVE

## 2022-05-04 LAB — HIV ANTIBODY (ROUTINE TESTING W REFLEX): HIV 1&2 Ab, 4th Generation: NONREACTIVE

## 2022-05-05 ENCOUNTER — Ambulatory Visit (INDEPENDENT_AMBULATORY_CARE_PROVIDER_SITE_OTHER): Payer: BC Managed Care – PPO

## 2022-05-05 DIAGNOSIS — Z23 Encounter for immunization: Secondary | ICD-10-CM | POA: Diagnosis not present

## 2022-05-05 LAB — QUANTIFERON-TB GOLD PLUS
QuantiFERON Mitogen Value: 8.72 IU/mL
QuantiFERON Nil Value: 0.03 IU/mL
QuantiFERON TB1 Ag Value: 0.04 IU/mL
QuantiFERON TB2 Ag Value: 0.05 IU/mL
QuantiFERON-TB Gold Plus: NEGATIVE

## 2022-05-05 NOTE — Progress Notes (Signed)
Per orders of Dr Carollee Leitz, co signed by Alma Friendly injection of Varicella titer vaccine given by Kris Mouton. Patient tolerated injection well.

## 2022-05-06 ENCOUNTER — Other Ambulatory Visit: Payer: Self-pay | Admitting: Nurse Practitioner

## 2022-05-06 DIAGNOSIS — Z539 Procedure and treatment not carried out, unspecified reason: Secondary | ICD-10-CM

## 2022-05-11 ENCOUNTER — Encounter: Payer: Self-pay | Admitting: Nurse Practitioner

## 2022-05-11 ENCOUNTER — Ambulatory Visit (INDEPENDENT_AMBULATORY_CARE_PROVIDER_SITE_OTHER): Payer: Self-pay | Admitting: Nurse Practitioner

## 2022-05-11 VITALS — BP 112/62 | HR 71 | Temp 98.5°F | Ht 64.0 in | Wt 141.0 lb

## 2022-05-11 DIAGNOSIS — Z8349 Family history of other endocrine, nutritional and metabolic diseases: Secondary | ICD-10-CM
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
## 2022-05-12 LAB — IRON AND TIBC
Iron Saturation: 22 % (ref 15–55)
Iron: 88 ug/dL (ref 27–159)
Total Iron Binding Capacity: 403 ug/dL (ref 250–450)
UIBC: 315 ug/dL (ref 131–425)

## 2022-05-12 LAB — LIPID PANEL
Chol/HDL Ratio: 2.6 ratio (ref 0.0–4.4)
Cholesterol, Total: 126 mg/dL (ref 100–199)
HDL: 49 mg/dL (ref >39)
LDL Chol Calc (NIH): 57 mg/dL (ref 0–99)
Triglycerides: 108 mg/dL (ref 0–149)
VLDL Cholesterol Cal: 20 mg/dL (ref 5–40)

## 2022-05-12 LAB — COMPREHENSIVE METABOLIC PANEL
ALT: 18 IU/L (ref 0–32)
AST: 21 IU/L (ref 0–40)
Albumin/Globulin Ratio: 1.4 (ref 1.2–2.2)
Albumin: 3.9 g/dL — ABNORMAL LOW (ref 4.0–5.0)
Alkaline Phosphatase: 51 IU/L (ref 44–121)
BUN/Creatinine Ratio: 35 — ABNORMAL HIGH (ref 9–23)
BUN: 21 mg/dL — ABNORMAL HIGH (ref 6–20)
Bilirubin Total: <0.2 mg/dL (ref 0.0–1.2)
CO2: 25 mmol/L (ref 20–29)
Calcium: 8.8 mg/dL (ref 8.7–10.2)
Chloride: 100 mmol/L (ref 96–106)
Creatinine, Ser: 0.6 mg/dL (ref 0.57–1.00)
Globulin, Total: 2.7 g/dL (ref 1.5–4.5)
Glucose: 93 mg/dL (ref 70–99)
Potassium: 4.5 mmol/L (ref 3.5–5.2)
Sodium: 137 mmol/L (ref 134–144)
Total Protein: 6.6 g/dL (ref 6.0–8.5)
eGFR: 131 mL/min/{1.73_m2} (ref >59)

## 2022-05-12 LAB — THYROID PANEL WITH TSH
Free Thyroxine Index: 1.4 (ref 1.2–4.9)
T3 Uptake Ratio: 19 % — ABNORMAL LOW (ref 24–39)
T4, Total: 7.6 ug/dL (ref 4.5–12.0)
TSH: 1.22 u[IU]/mL (ref 0.450–4.500)

## 2022-05-12 LAB — CBC WITH DIFFERENTIAL/PLATELET
Basophils Absolute: 0 10*3/uL (ref 0.0–0.2)
Basos: 0 %
EOS (ABSOLUTE): 0.1 10*3/uL (ref 0.0–0.4)
Eos: 1 %
Hematocrit: 39.4 % (ref 34.0–46.6)
Hemoglobin: 13.5 g/dL (ref 11.1–15.9)
Immature Grans (Abs): 0 10*3/uL (ref 0.0–0.1)
Immature Granulocytes: 0 %
Lymphocytes Absolute: 1.7 10*3/uL (ref 0.7–3.1)
Lymphs: 18 %
MCH: 32.5 pg (ref 26.6–33.0)
MCHC: 34.3 g/dL (ref 31.5–35.7)
MCV: 95 fL (ref 79–97)
Monocytes Absolute: 0.5 10*3/uL (ref 0.1–0.9)
Monocytes: 5 %
Neutrophils Absolute: 7.4 10*3/uL — ABNORMAL HIGH (ref 1.4–7.0)
Neutrophils: 76 %
Platelets: 308 10*3/uL (ref 150–450)
RBC: 4.16 x10E6/uL (ref 3.77–5.28)
RDW: 11.9 % (ref 11.7–15.4)
WBC: 9.7 10*3/uL (ref 3.4–10.8)

## 2022-05-12 LAB — FERRITIN: Ferritin: 40 ng/mL (ref 15–150)

## 2022-05-12 LAB — TRANSFERRIN: Transferrin: 325 mg/dL (ref 192–364)

## 2022-05-13 ENCOUNTER — Encounter: Payer: Self-pay | Admitting: Nurse Practitioner

## 2022-06-02 ENCOUNTER — Other Ambulatory Visit: Payer: BC Managed Care – PPO

## 2022-06-02 DIAGNOSIS — Z0184 Encounter for antibody response examination: Secondary | ICD-10-CM

## 2022-06-02 DIAGNOSIS — Z539 Procedure and treatment not carried out, unspecified reason: Secondary | ICD-10-CM

## 2022-06-03 LAB — VARICELLA ZOSTER ANTIBODY, IGG: Varicella zoster IgG: 364 index (ref >165)

## 2022-06-03 NOTE — Telephone Encounter (Signed)
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  Please call when ready.

## 2022-06-06 ENCOUNTER — Encounter: Payer: Self-pay | Admitting: Nurse Practitioner

## 2022-06-06 NOTE — Telephone Encounter (Signed)
I called and spoke with the patient and informed her that her form is available for pickup and she understood.  Chevonne Bostrom,cma

## 2022-06-06 NOTE — Telephone Encounter (Signed)
Pt called wanting an update on her paperwork

## 2022-06-14 ENCOUNTER — Telehealth: Payer: Self-pay | Admitting: Family Medicine

## 2022-06-14 NOTE — Telephone Encounter (Signed)
Changed codes.  If not correct ask them to give you a list of codes please

## 2022-06-14 NOTE — Telephone Encounter (Signed)
[employer] called stating the pt labs were denied due to the codes being put in for April 30, 2022. Once every thing is correct Amanda Finley request the provider to reach out to the member

## 2022-06-14 NOTE — Telephone Encounter (Signed)
I called and informed the patient that the codes were fixed and she understood.  Amanda Finley,cma

## 2022-10-12 ENCOUNTER — Other Ambulatory Visit: Payer: Self-pay

## 2022-10-12 ENCOUNTER — Ambulatory Visit: Payer: BC Managed Care – PPO

## 2022-10-12 DIAGNOSIS — Z538 Procedure and treatment not carried out for other reasons: Secondary | ICD-10-CM

## 2022-10-12 NOTE — Progress Notes (Signed)
Immunization not needed for clinical requirement.  Patient has records of immunizations.  I did show her how she can reprint records from [place].  I also advised her on how she can logon to [facility] Portal to make sure she is in compliance with university.    No vitals necessary.

## 2022-10-24 IMAGING — US US SOFT TISSUE HEAD/NECK
1 series · 14 of 21 positions shown · non-contrast
Comparison: None.

CLINICAL DATA: Mild cervical adenopathy for 2 years

EXAM:
ULTRASOUND OF HEAD/NECK SOFT TISSUES
TECHNIQUE: Ultrasound examination of the head and neck soft tissues was
performed in the area of clinical concern.

[Series 1: us soft tissue head/neck · 0.07mm/px · 14 of 21 slices shown]
[im 1/21]
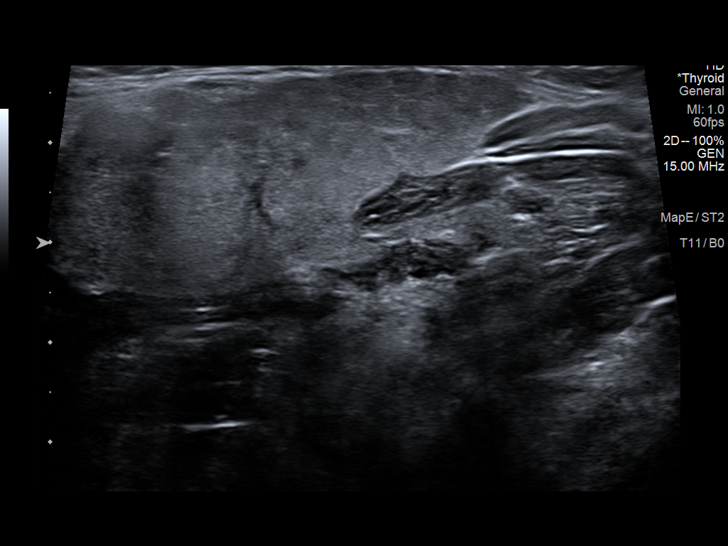
[im 3/21]
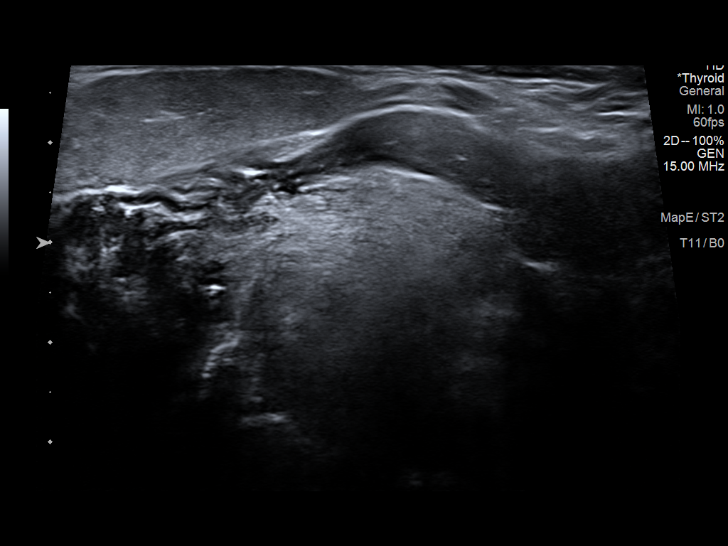
[im 4/21]
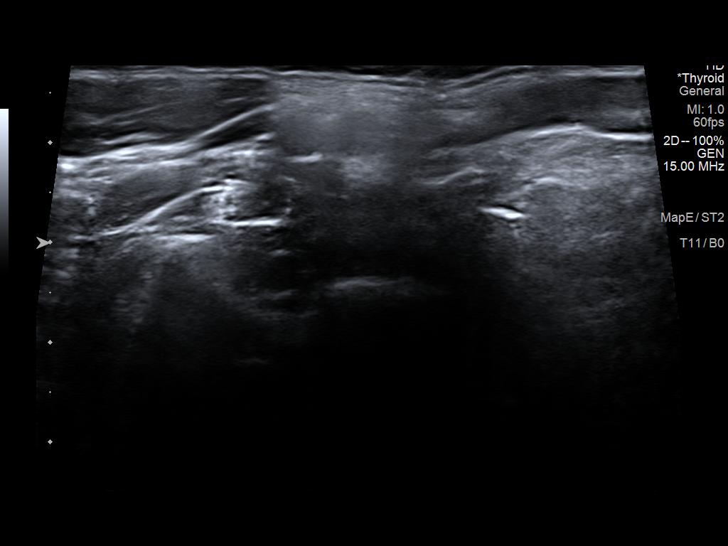
[im 6/21]
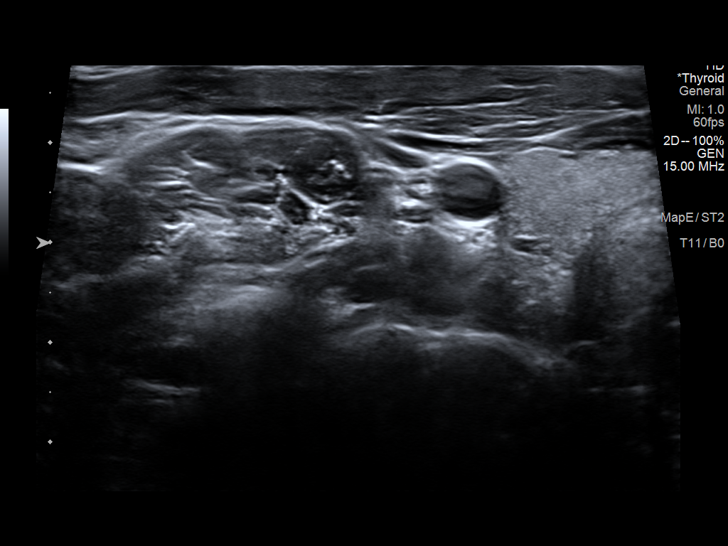
[im 7/21]
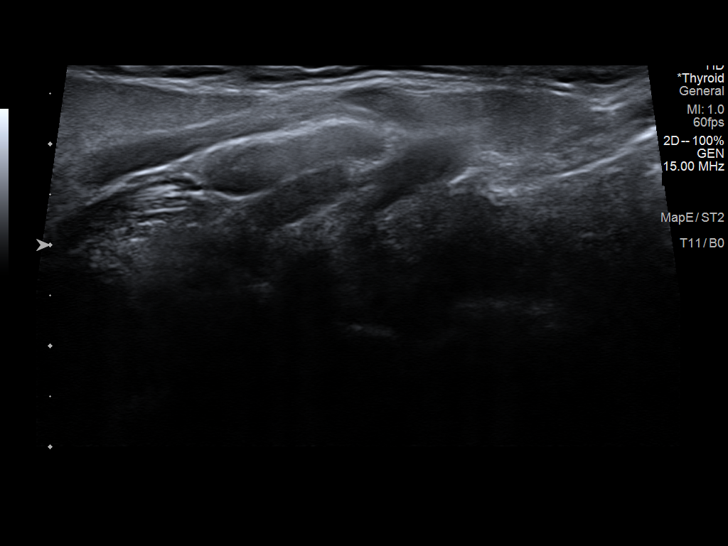
[im 9/21]
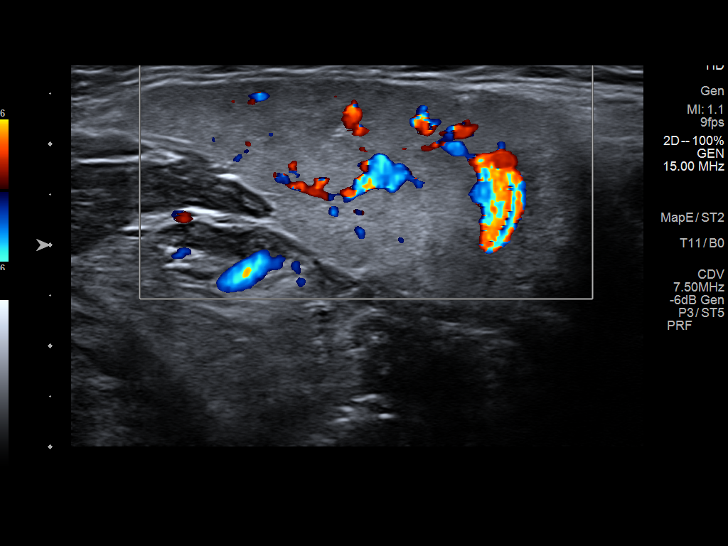
[im 10/21]
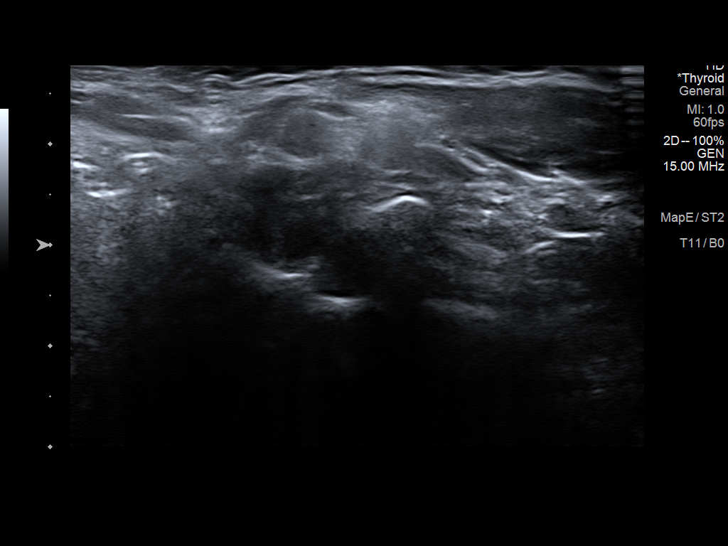
[im 12/21]
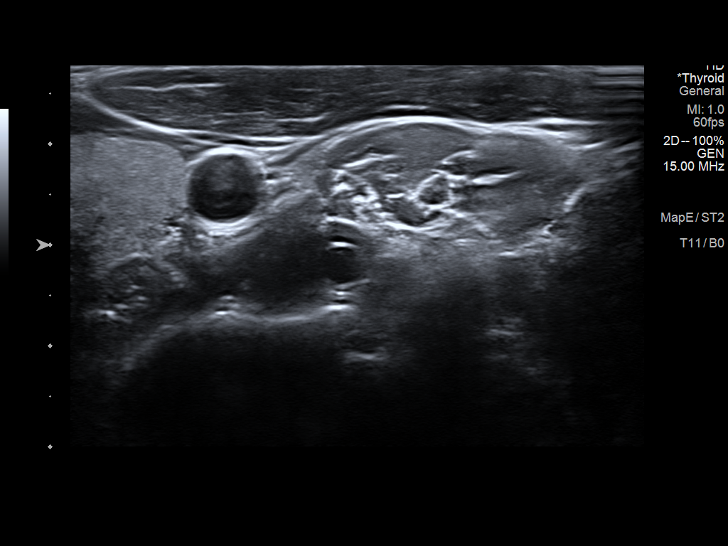
[im 13/21]
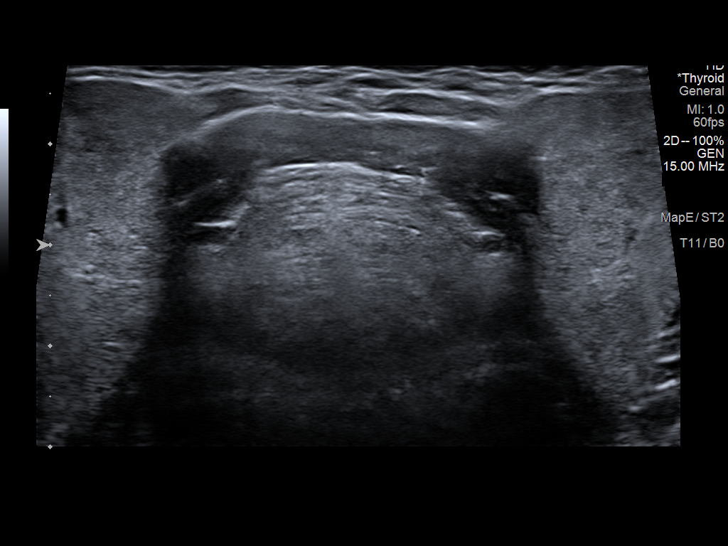
[im 15/21]
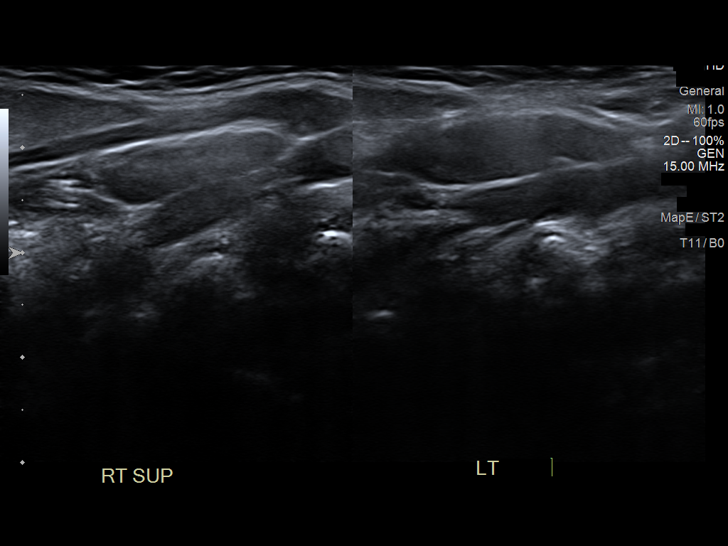
[im 16/21]
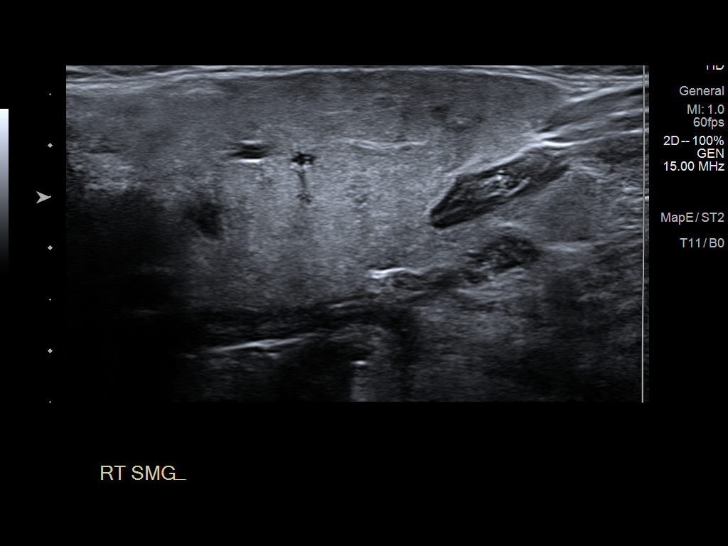
[im 18/21]
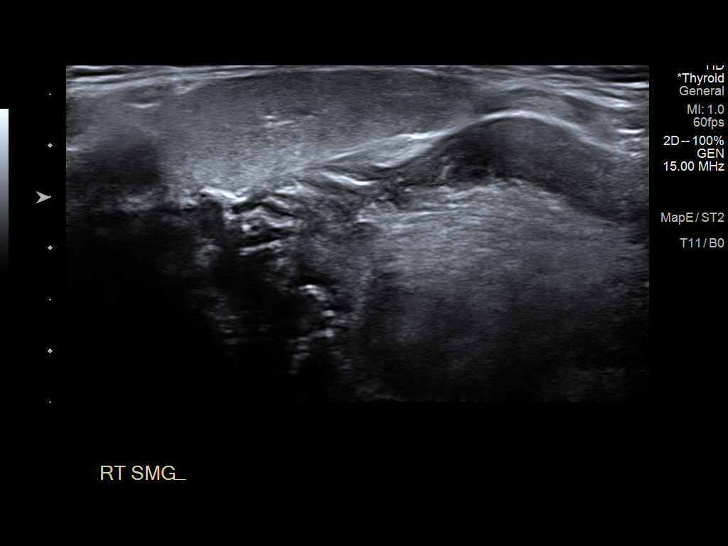
[im 19/21]
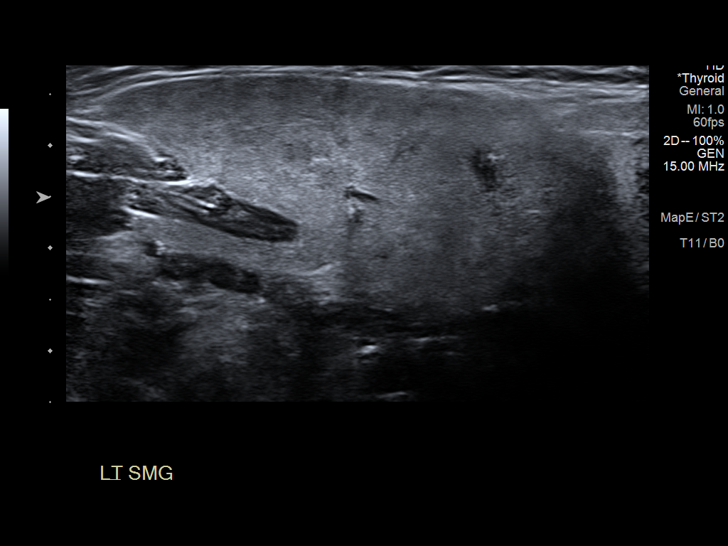
[im 21/21]
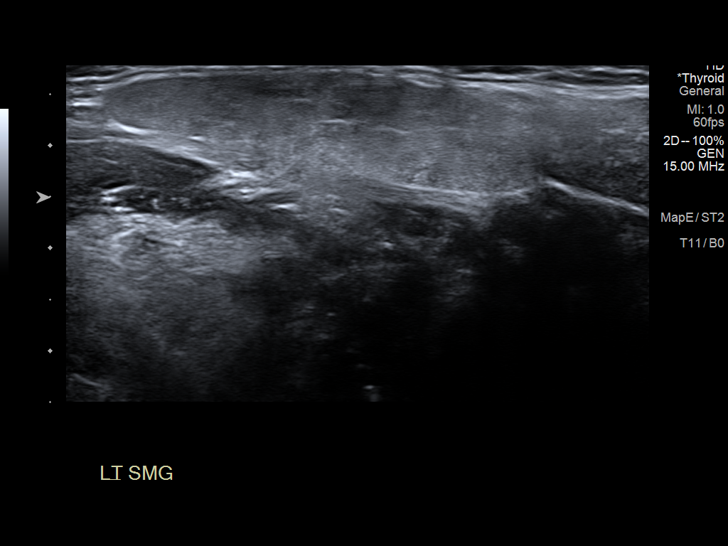

[14 of 21 positions shown; findings below may reference images not displayed]

FINDINGS: Sonographic evaluation of the neck demonstrates nonenlarged lymph
nodes.

The submandibular glands appear symmetrically prominent.
IMPRESSION: 1. No enlarged neck lymph nodes are identified.
2. Symmetric prominence of the submandibular glands.

## 2022-11-10 ENCOUNTER — Telehealth: Payer: Self-pay | Admitting: Family Medicine

## 2022-11-10 NOTE — Telephone Encounter (Signed)
Pt mom called stating she called [facility] for a lab that was done on 10/12. Pt stated that [facility] told her the provider may have put in the wrong code and that it was not coded right. Pt mom was told to call us and billing to have it corrected. Pt mom stated she will bring bill by tomorrow

## 2022-11-11 NOTE — Telephone Encounter (Signed)
Patient's mother dropped off [facility] invoice for Dr Clent Ridges to review codes. Paper is up front in Dr Claris Che color folder.

## 2022-11-14 ENCOUNTER — Ambulatory Visit: Payer: BC Managed Care – PPO | Admitting: Family Medicine

## 2022-11-14 NOTE — Telephone Encounter (Signed)
In your review folder.

## 2022-11-15 NOTE — Telephone Encounter (Signed)
Sent mychart message

## 2022-11-16 NOTE — Telephone Encounter (Signed)
Last read by Arty Baumgartner at  8:21 AM on 11/16/2022.

## 2023-02-01 ENCOUNTER — Ambulatory Visit (INDEPENDENT_AMBULATORY_CARE_PROVIDER_SITE_OTHER): Payer: Self-pay | Admitting: Adult Health

## 2023-02-01 ENCOUNTER — Encounter: Payer: Self-pay | Admitting: Adult Health

## 2023-02-01 ENCOUNTER — Other Ambulatory Visit: Payer: Self-pay

## 2023-02-01 VITALS — BP 127/81 | HR 80 | Temp 99.7°F | Wt 141.0 lb

## 2023-02-01 DIAGNOSIS — Z111 Encounter for screening for respiratory tuberculosis: Secondary | ICD-10-CM

## 2023-02-01 DIAGNOSIS — Z029 Encounter for administrative examinations, unspecified: Secondary | ICD-10-CM
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
## 2023-02-03 LAB — QUANTIFERON-TB GOLD PLUS
QuantiFERON Mitogen Value: >10 IU/mL
QuantiFERON Nil Value: 0.03 IU/mL
QuantiFERON TB1 Ag Value: 0.04 IU/mL
QuantiFERON TB2 Ag Value: 0.04 IU/mL
QuantiFERON-TB Gold Plus: NEGATIVE

## 2023-02-20 ENCOUNTER — Other Ambulatory Visit: Payer: BC Managed Care – PPO

## 2023-02-22 ENCOUNTER — Encounter: Payer: BC Managed Care – PPO | Admitting: Family Medicine

## 2023-10-18 ENCOUNTER — Other Ambulatory Visit: Payer: Self-pay | Admitting: Family Medicine

## 2023-10-18 NOTE — Telephone Encounter (Signed)
 Copied from CRM [id number]. Topic: Clinical - Medication Refill >> Oct 18, 2023 10:58 AM Cammy Copa D wrote: Most Recent Primary Care Visit:  Provider: Consuella Lose  Department: [facility]  Visit Type: NURSE VISIT  Date: 05/05/2022  Medication: TRI-ESTARYLLA 0.18/0.215/0.25 MG-35 MCG tablet  Has the patient contacted their pharmacy? No (Agent: If no, request that the patient contact the pharmacy for the refill. If patient does not wish to contact the pharmacy document the reason why and proceed with request.) 0 refills, was told to call her Dr.  (Agent: If yes, when and what did the pharmacy advise?)  Is this the correct pharmacy for this prescription? Yes If no, delete pharmacy and type the correct one.  This is the patient's preferred pharmacy:  [facility] [address] [place] - [address] [place] [address] Phone: [phone] Fax: [phone]   Has the prescription been filled recently? Yes  Is the patient out of the medication? No  Has the patient been seen for an appointment in the last year OR does the patient have an upcoming appointment? Yes  Can we respond through MyChart? Yes  Agent: Please be advised that Rx refills may take up to 3 business days. We ask that you follow-up with your pharmacy.

## 2023-10-26 ENCOUNTER — Other Ambulatory Visit: Payer: Self-pay

## 2023-10-26 ENCOUNTER — Encounter: Payer: Self-pay | Admitting: Adult Health

## 2023-10-26 ENCOUNTER — Ambulatory Visit (INDEPENDENT_AMBULATORY_CARE_PROVIDER_SITE_OTHER): Payer: Self-pay | Admitting: Adult Health

## 2023-10-26 VITALS — BP 102/73 | HR 71 | Temp 98.2°F | Ht 65.0 in | Wt 140.0 lb

## 2023-10-26 DIAGNOSIS — Z029 Encounter for administrative examinations, unspecified: Secondary | ICD-10-CM

## 2023-10-26 DIAGNOSIS — Z30011 Encounter for initial prescription of contraceptive pills: Secondary | ICD-10-CM

## 2023-10-26 MED ORDER — NORGESTIM-ETH ESTRAD TRIPHASIC 0.18/0.215/0.25 MG-35 MCG PO TABS: 1.0000 | ORAL_TABLET | Freq: Every day | ORAL | 11 refills | Status: AC
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
## 2023-10-28 LAB — CBC WITH DIFFERENTIAL/PLATELET
Basophils Absolute: 0 10*3/uL (ref 0.0–0.2)
Basos: 0 %
EOS (ABSOLUTE): 0 10*3/uL (ref 0.0–0.4)
Eos: 1 %
Hematocrit: 42.6 % (ref 34.0–46.6)
Hemoglobin: 14.4 g/dL (ref 11.1–15.9)
Immature Grans (Abs): 0 10*3/uL (ref 0.0–0.1)
Immature Granulocytes: 0 %
Lymphocytes Absolute: 1.8 10*3/uL (ref 0.7–3.1)
Lymphs: 28 %
MCH: 32.3 pg (ref 26.6–33.0)
MCHC: 33.8 g/dL (ref 31.5–35.7)
MCV: 96 fL (ref 79–97)
Monocytes Absolute: 0.5 10*3/uL (ref 0.1–0.9)
Monocytes: 8 %
Neutrophils Absolute: 4 10*3/uL (ref 1.4–7.0)
Neutrophils: 63 %
Platelets: 306 10*3/uL (ref 150–450)
RBC: 4.46 x10E6/uL (ref 3.77–5.28)
RDW: 11.9 % (ref 11.7–15.4)
WBC: 6.3 10*3/uL (ref 3.4–10.8)

## 2023-10-28 LAB — COMPREHENSIVE METABOLIC PANEL WITH GFR
ALT: 15 IU/L (ref 0–32)
AST: 18 IU/L (ref 0–40)
Albumin: 4.6 g/dL (ref 4.0–5.0)
Alkaline Phosphatase: 37 IU/L — ABNORMAL LOW (ref 44–121)
BUN/Creatinine Ratio: 28 — ABNORMAL HIGH (ref 9–23)
BUN: 19 mg/dL (ref 6–20)
Bilirubin Total: 0.2 mg/dL (ref 0.0–1.2)
CO2: 21 mmol/L (ref 20–29)
Calcium: 9.6 mg/dL (ref 8.7–10.2)
Chloride: 99 mmol/L (ref 96–106)
Creatinine, Ser: 0.69 mg/dL (ref 0.57–1.00)
Globulin, Total: 2.6 g/dL (ref 1.5–4.5)
Glucose: 114 mg/dL — ABNORMAL HIGH (ref 70–99)
Potassium: 4.2 mmol/L (ref 3.5–5.2)
Sodium: 137 mmol/L (ref 134–144)
Total Protein: 7.2 g/dL (ref 6.0–8.5)
eGFR: 125 mL/min/{1.73_m2} (ref >59)

## 2023-10-28 LAB — FERRITIN: Ferritin: 22 ng/mL (ref 15–150)

## 2023-10-28 LAB — IRON AND TIBC
Iron Saturation: 21 % (ref 15–55)
Iron: 103 ug/dL (ref 27–159)
Total Iron Binding Capacity: 492 ug/dL — ABNORMAL HIGH (ref 250–450)
UIBC: 389 ug/dL (ref 131–425)

## 2023-10-28 LAB — B12 AND FOLATE PANEL
Folate: 7.9 ng/mL (ref >3.0)
Vitamin B-12: 531 pg/mL (ref 232–1245)

## 2023-10-28 LAB — VITAMIN D 25 HYDROXY (VIT D DEFICIENCY, FRACTURES): Vit D, 25-Hydroxy: 50.9 ng/mL (ref 30.0–100.0)

## 2023-10-28 LAB — RPR QUALITATIVE: RPR Ser Ql: NONREACTIVE

## 2023-10-28 LAB — TSH: TSH: 0.659 u[IU]/mL (ref 0.450–4.500)

## 2023-10-28 LAB — HIV ANTIBODY (ROUTINE TESTING W REFLEX): HIV Screen 4th Generation wRfx: NONREACTIVE

## 2023-10-29 LAB — CHLAMYDIA/GONOCOCCUS/TRICHOMONAS, NAA
Chlamydia by NAA: NEGATIVE
Gonococcus by NAA: NEGATIVE
Trich vag by NAA: NEGATIVE

## 2023-10-29 LAB — CT/GC NAA, PHARYNGEAL
C TRACH RRNA NPH QL PCR: NEGATIVE
N GONORRHOEA RRNA NPH QL PCR: NEGATIVE

## 2023-10-30 ENCOUNTER — Encounter: Payer: Self-pay | Admitting: Adult Health

## 2023-12-05 ENCOUNTER — Telehealth: Payer: Self-pay

## 2023-12-05 NOTE — Telephone Encounter (Signed)
 Copied from CRM [id number]. Topic: Referral - Question >> Dec 05, 2023 12:27 PM Aisha D wrote: Reason for CRM: Pt wants to know if she has to have a referral to schedule with an ob gyn. Pt also would like to know if Dr.Walsh has any ob gyn's that she recommends.

## 2023-12-07 ENCOUNTER — Other Ambulatory Visit: Payer: Self-pay | Admitting: Family Medicine

## 2023-12-07 DIAGNOSIS — Z7689 Persons encountering health services in other specified circumstances: Secondary | ICD-10-CM

## 2023-12-13 NOTE — Telephone Encounter (Signed)
 Last read by Fredonia Jay at 10:39AM on 12/08/2023.

## 2024-01-10 ENCOUNTER — Ambulatory Visit (INDEPENDENT_AMBULATORY_CARE_PROVIDER_SITE_OTHER): Payer: Self-pay | Admitting: Adult Health

## 2024-01-10 ENCOUNTER — Encounter: Payer: Self-pay | Admitting: Adult Health

## 2024-01-10 ENCOUNTER — Other Ambulatory Visit: Payer: Self-pay

## 2024-01-10 VITALS — BP 94/57 | HR 63 | Temp 98.5°F | Ht 65.0 in | Wt 140.0 lb

## 2024-01-10 DIAGNOSIS — Z111 Encounter for screening for respiratory tuberculosis: Secondary | ICD-10-CM

## 2024-01-10 DIAGNOSIS — Z029 Encounter for administrative examinations, unspecified: Secondary | ICD-10-CM
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
## 2024-01-12 LAB — QUANTIFERON-TB GOLD PLUS
QuantiFERON Mitogen Value: >10 [IU]/mL
QuantiFERON Nil Value: 0.05 [IU]/mL
QuantiFERON TB1 Ag Value: 0.02 [IU]/mL
QuantiFERON TB2 Ag Value: 0.03 [IU]/mL
QuantiFERON-TB Gold Plus: NEGATIVE

## 2024-01-16 ENCOUNTER — Other Ambulatory Visit (HOSPITAL_COMMUNITY)
Admission: RE | Admit: 2024-01-16 | Discharge: 2024-01-16 | Disposition: A | Discharge status: Home / Self Care | Source: Ambulatory Visit | Attending: Certified Nurse Midwife | Admitting: Certified Nurse Midwife

## 2024-01-16 ENCOUNTER — Encounter: Payer: Self-pay | Admitting: Certified Nurse Midwife

## 2024-01-16 ENCOUNTER — Ambulatory Visit: Admitting: Certified Nurse Midwife

## 2024-01-16 VITALS — BP 94/63 | HR 57 | Wt 138.9 lb

## 2024-01-16 DIAGNOSIS — Z01419 Encounter for gynecological examination (general) (routine) without abnormal findings: Secondary | ICD-10-CM

## 2024-01-16 DIAGNOSIS — Z124 Encounter for screening for malignant neoplasm of cervix: Secondary | ICD-10-CM

## 2024-01-16 DIAGNOSIS — Z3040 Encounter for surveillance of contraceptives, unspecified: Secondary | ICD-10-CM

## 2024-01-16 DIAGNOSIS — Z1272 Encounter for screening for malignant neoplasm of vagina: Secondary | ICD-10-CM | POA: Diagnosis not present

## 2024-01-16 DIAGNOSIS — Z8489 Family history of other specified conditions: Secondary | ICD-10-CM
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
## 2024-01-18 ENCOUNTER — Ambulatory Visit: Payer: Self-pay | Admitting: Certified Nurse Midwife

## 2024-01-18 LAB — CYTOLOGY - PAP
Adequacy: ABSENT
Diagnosis: NEGATIVE

## 2024-03-12 ENCOUNTER — Encounter: Payer: Self-pay | Admitting: Medical

## 2024-03-12 ENCOUNTER — Ambulatory Visit (INDEPENDENT_AMBULATORY_CARE_PROVIDER_SITE_OTHER): Payer: Self-pay | Admitting: Medical

## 2024-03-12 DIAGNOSIS — J069 Acute upper respiratory infection, unspecified: Secondary | ICD-10-CM

## 2024-03-12 DIAGNOSIS — R051 Acute cough: Secondary | ICD-10-CM

## 2024-03-12 MED ORDER — BENZONATATE 100 MG PO CAPS: ORAL_CAPSULE | ORAL | 0 refills | Status: AC

## 2024-03-12 NOTE — Patient Instructions (Addendum)
-  Rest and stay well hydrated (by drinking water and other liquids). Avoid/limit caffeine. -Consider taking another at-home COVID-19 test in the next day or two. -Take over-the-counter medicines (i.e. Tylenol or Ibuprofen, Sudafed) to help relieve your symptoms. -Try Benzonatate  every 8 hours as needed for cough. -For your sore throat/cough, use cough drops/throat lozenges, gargle warm salt water and/or drink warm liquids (like tea with honey). -Send MyChart message to provider or schedule in-person visit as needed for new/worsening symptoms (i.e. fever, shortness of breath, worsening throat pain) or if symptoms do not improve as discussed with recommended treatment over next 5-7 days.
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
## 2024-04-17 ENCOUNTER — Ambulatory Visit (INDEPENDENT_AMBULATORY_CARE_PROVIDER_SITE_OTHER): Payer: Self-pay

## 2024-04-17 DIAGNOSIS — Z23 Encounter for immunization: Secondary | ICD-10-CM

## 2024-05-24 ENCOUNTER — Encounter: Admitting: Nurse Practitioner
